# Patient Record
Sex: Female | Born: 1980 | Race: White | Hispanic: No | Marital: Married | Smoking: Former smoker
Health system: Southern US, Community
[De-identification: ages and names within clinical notes are randomized; demographics above are authoritative.]

## PROBLEM LIST (undated history)

## (undated) DIAGNOSIS — F419 Anxiety disorder, unspecified: Secondary | ICD-10-CM

## (undated) DIAGNOSIS — T7840XA Allergy, unspecified, initial encounter: Secondary | ICD-10-CM

## (undated) DIAGNOSIS — K8020 Calculus of gallbladder without cholecystitis without obstruction: Secondary | ICD-10-CM

## (undated) DIAGNOSIS — N200 Calculus of kidney: Secondary | ICD-10-CM

## (undated) HISTORY — PX: WISDOM TOOTH EXTRACTION: SHX21

## (undated) HISTORY — DX: Calculus of gallbladder without cholecystitis without obstruction: K80.20

## (undated) HISTORY — DX: Calculus of kidney: N20.0

## (undated) HISTORY — DX: Anxiety disorder, unspecified: F41.9

## (undated) HISTORY — DX: Allergy, unspecified, initial encounter: T78.40XA

---

## 2003-10-29 ENCOUNTER — Emergency Department (HOSPITAL_COMMUNITY): Admission: EM | Admit: 2003-10-29 | Discharge: 2003-10-29 | Payer: Self-pay | Admitting: Emergency Medicine

## 2005-01-14 ENCOUNTER — Encounter: Admission: RE | Admit: 2005-01-14 | Discharge: 2005-01-14 | Payer: Self-pay | Admitting: Family Medicine

## 2005-06-01 ENCOUNTER — Emergency Department (HOSPITAL_COMMUNITY): Admission: EM | Admit: 2005-06-01 | Discharge: 2005-06-01 | Payer: Self-pay | Admitting: Emergency Medicine

## 2005-07-09 ENCOUNTER — Ambulatory Visit (HOSPITAL_COMMUNITY): Admission: RE | Admit: 2005-07-09 | Discharge: 2005-07-09 | Payer: Self-pay | Admitting: Internal Medicine

## 2005-07-09 ENCOUNTER — Encounter: Payer: Self-pay | Admitting: Cardiology

## 2007-03-18 ENCOUNTER — Ambulatory Visit: Payer: Self-pay | Admitting: Cardiovascular Disease

## 2007-03-30 ENCOUNTER — Encounter: Payer: Self-pay | Admitting: Cardiovascular Disease

## 2007-03-30 ENCOUNTER — Ambulatory Visit: Payer: Self-pay

## 2007-04-27 ENCOUNTER — Ambulatory Visit: Payer: Self-pay | Admitting: Cardiovascular Disease

## 2007-04-27 ENCOUNTER — Ambulatory Visit: Payer: Self-pay | Admitting: Cardiology

## 2007-05-09 ENCOUNTER — Ambulatory Visit: Payer: Self-pay | Admitting: Cardiovascular Disease

## 2009-10-09 ENCOUNTER — Encounter
Admission: RE | Admit: 2009-10-09 | Discharge: 2010-01-07 | Payer: Self-pay | Source: Home / Self Care | Attending: Physical Medicine & Rehabilitation | Admitting: Physical Medicine & Rehabilitation

## 2009-10-15 ENCOUNTER — Ambulatory Visit: Payer: Self-pay | Admitting: Physical Medicine & Rehabilitation

## 2009-11-11 ENCOUNTER — Ambulatory Visit: Payer: Self-pay | Admitting: Physical Medicine & Rehabilitation

## 2010-05-27 NOTE — Assessment & Plan Note (Signed)
[facility]                            CARDIOLOGY OFFICE NOTE   NAME:Danielle [name], Danielle [name]                       MRN:          [id number]  DATE:03/18/2007                            DOB:          02-27-1980    Ms. Danielle [name] a 30-year patient referred by Dr. Kendell Bane for chest pain,  palpitations.  The patient has been having a problem since last summer.  Apparently she had a first anxiety attack then.  She has been having  palpitations.  They can occur at any time and not related to exertion.  I do have lab work from 2007 that showed normal thyroid test.  Her  father is a physician and apparently she has had an echo in his office  in August, which was normal without any significant MVP or structural  abnormalities.  We have an echo from 2007 which was also normal.   The palpitations did not cause any significant syncope.  They can be  associated with her chest pain.  The chest pain is atypical and can wake  her up in the middle the night.  It is sharp.  It is left-sided.  It has  been ongoing since August and has been a bit progressive over the last  month or two.   There has been no associated syncope.  She has not had a recent stress  test.   REVIEW OF SYSTEMS:  Otherwise negative.   PAST MEDICAL HISTORY:  Remarkable for history of adult attention deficit  disorder, previously on Adderall.  Did not tolerate this.  History of  back problems with disks inflamed, the patient is a previous smoker,  quit 3 years ago.  Question history of a reflux with trial of Nexium.   Patient is allergic to PENICILLIN and AMOXICILLIN.  She is currently  still taking Nexium 40 a day and doxycycline.   FAMILY HISTORY:  Noncontributory.  Mother and father both still alive at  age 35.   The patient is single.  She has a roommate.  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  She does not do any  illicit drugs.  She is fairly active at work but  sedentary outside.  She  has many animals at home that she likes to care for as a hobby.   EXAM:  Remarkable for healthy-appearing young white female in no  distress.  Her weight is 120, blood pressure is 120/70, pulse is 75 with  significant sinus arrhythmia, respiratory rate 14, afebrile.  HEENT:  Unremarkable.  Carotids are without bruit, no lymphadenopathy, thyromegaly JVP  elevation.  LUNGS:  Clear good diaphragmatic motion.  No wheezing.  S1-S2 with no murmur, rub, or gallop, click.  ABDOMEN:  Benign.  Bowel sounds positive.  No AAA, no tenderness, no  hepatosplenomegaly, no hepatojugular reflux.  Distal pulses intact.  No edema.  NEURO:  Nonfocal.  SKIN:  Warm and dry.  No muscular weakness.   EKG is normal.  She does have significant sinus arrhythmia with her  heart rate varying between 75 and 100  just with inspiration.   IMPRESSION:  1. Palpitations.  Sound benign.  No evidence structural heart disease.      She appears to have significant high autonomic tone with sinus      arrhythmia.  Will try the level this out with low-dose Viskin 5 mg      and see her back in 8 weeks.  I do not think there is an indication      for monitoring at this point.  2. Chest pain, atypical.  Nonexertional.  Followup stress echo, I      prefer to do this in a young female to avoid any radiation dose.      Previously structurally normal heart.  3. Acne.  Continue doxycycline p.r.n.  4. Adult onset attention deficit disorder.  Avoid stimulants such as      Adderall.  There are new non stimulant drugs for this but I suspect      that the patient is going to try to do with out any drugs for this.      She seems to have had an easier time concentrating lately.  5. Anxiety attacks.  This may be the root of her problems.  She is not      on any anxiolytics or SSRIs.  I will leave this up to her primary      care doctor.   I will see her back in 8 weeks so long as her stress echo is normal and   see how she feels on the Viskin.     Noralyn Pick. Eden Emms, MD, [facility]  Electronically Signed    PCN/MedQ  DD: 03/18/2007  DT: 03/18/2007  Job #: [id number]

## 2010-05-27 NOTE — Assessment & Plan Note (Signed)
[facility]                            CARDIOLOGY OFFICE NOTE   NAME:[name], Danielle CREDIT                       MRN:          [id number]  DATE:05/09/2007                            DOB:          1980/02/01    Danielle [name] was seen today in followup.  She has had atypical chest  pain.   She had a CT scan which showed no evidence of PE and no other thoracic  abnormalities.  She had a 2-D echocardiogram which showed normal LV  function.  She subsequently had stress echo performed which was normal.   She had no chest pain during the study.   In talking to Danielle [name], she continues to have atypical chest pain that  sounds musculoskeletal or something like Tietze syndrome.  She has not  had any syncope, palpitations, PND, or orthopnea.  There has been no  shortness of breath and no syncope.   REVIEW OF SYSTEMS:  Remarkable for recent developments in regards to  having cysts on her ovary and multiple kidney stones.  She was in the ER  recently and has a followup with a urologist in [facility] named Dr.  Lindley Magnus.   Since I last saw her she has been on the Visken 5 mg b.i.d. and feels  that she is doing better with less chest pain.   I told her we would try to keep her on the Visken probably for a year  until some of her other medical issues resolve.   CURRENT MEDICATIONS:  1. Nexium 40 a day.  2. Doxycycline.  3. Pindolol 5 b.i.d.  4. She has also gotten some Percocet for her kidney stones.   PHYSICAL EXAMINATION:  VITAL SIGNS:  Blood pressure 120/70, pulse is 70  with less of a sinus arrhythmia, respiratory rate 16, afebrile.  HEENT:  Unremarkable.  NECK:  Carotids are without bruit, no lymphadenopathy, thyromegaly, JVP  elevation.  LUNGS:  Clear with diaphragmatic motion.  No wheezing.  S1-S2 with  normal heart sounds.  PMI normal.  No pain to palpation on the chest.  ABDOMEN:  Benign.  Bowel sounds positive.  No hepatosplenomegaly or  hepatojugular  reflexes.  No AAA, no bruit.  EXTREMITIES:  Distal pulses intact, no edema.  NEUROLOGIC:  Nonfocal.  SKIN:  Warm and dry.  No muscular weakness.   IMPRESSION:  1. Chest pain, atypical.  Continue analgesics.  The patient has      Percocet for kidney stones which should help.  2. Sinus arrhythmia with variable heart rates, smoothed out by beta      blocker therapy which seems to be helping her atypical chest pain.      Continue followup in 6 months.  3. Kidney stones.  Follow up with Dr. Lindley Magnus.  She is currently not      having her urine alkalinized.  She has not passed any stones and      feels that the hematuria is improved.  Pain control per emergency      room prescription.  4. History of reflux.  Continue Nexium.  Avoid spicy foods  and late-      night meals.  5. Ovarian cysts.  Follow up with gynecologist.   Overall, I think the patient is doing well.  I will see her back in 6  months.  The only other test that may be in order if she continues to  have chest pain would be a cardiac CT to rule out anomalous coronary  arteries.     Noralyn Pick. Eden Emms, MD, [facility]  Electronically Signed    PCN/MedQ  DD: 05/09/2007  DT: 05/09/2007  Job #: [id number]

## 2012-03-17 ENCOUNTER — Ambulatory Visit (INDEPENDENT_AMBULATORY_CARE_PROVIDER_SITE_OTHER): Payer: BC Managed Care – PPO | Admitting: Family Medicine

## 2012-03-17 VITALS — BP 128/96 | HR 98 | Temp 97.5°F | Resp 16 | Ht 64.0 in | Wt 103.0 lb

## 2012-03-17 DIAGNOSIS — R634 Abnormal weight loss: Secondary | ICD-10-CM

## 2012-03-17 DIAGNOSIS — R109 Unspecified abdominal pain: Secondary | ICD-10-CM

## 2012-03-17 LAB — POCT CBC
Granulocyte percent: 52.8 %G (ref 37–80)
Hemoglobin: 14.3 g/dL (ref 12.2–16.2)
Lymph, poc: 3.6 — AB (ref 0.6–3.4)
MCH, POC: 30.9 pg (ref 27–31.2)
MCHC: 31.9 g/dL (ref 31.8–35.4)
POC Granulocyte: 4.6 (ref 2–6.9)
POC LYMPH PERCENT: 41.3 %L (ref 10–50)
POC MID %: 5.9 %M (ref 0–12)
RBC: 4.63 M/uL (ref 4.04–5.48)
RDW, POC: 12.9 %
WBC: 8.8 10*3/uL (ref 4.6–10.2)

## 2012-03-17 LAB — POCT URINALYSIS DIPSTICK
Bilirubin, UA: NEGATIVE
Glucose, UA: NEGATIVE
Ketones, UA: NEGATIVE
Protein, UA: NEGATIVE

## 2012-03-17 LAB — POCT UA - MICROSCOPIC ONLY
Casts, Ur, LPF, POC: NEGATIVE
Crystals, Ur, HPF, POC: NEGATIVE
Yeast, UA: NEGATIVE

## 2012-03-17 NOTE — Patient Instructions (Addendum)
Abdominal  pain, other specified site - Plan: POCT SEDIMENTATION RATE, POCT CBC, TSH, POCT UA - Microscopic Only, POCT urinalysis dipstick, Comprehensive metabolic panel  Weight loss, unintentional

## 2012-03-17 NOTE — Progress Notes (Signed)
[address]   [place], [place]  [address]   [id number]  Subjective:    Patient ID: Danielle Danielle [name], female    DOB: 1980/10/27, 32 y.o.   MRN: [id number]  HPI This 32 y.o. female presents for evaluation of the following:  1.  LLQ abdominal: onset one week ago.  No fever in past week. Sick one week ago with fever, coughing, nasal congestion.  No n/v; +diarrhea x 2-3 episodes a few days ago; no bloody stools; no melena.  No mucous in stool.  No constipation.  History of kidney stones; +urgency chronically; no dysuria, no hematuria.  No flank pain.  No vaginal discharge; no vaginal irritation.  Sexually active with females only; no recent female encounters.  History of ovarian cyst rupture five years ago; this pain not as severe.  No ulcerative colitis or Crohn's disease in family.  Worsening pain some in past few days.  Intermittent change in position makes worse.  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  Getting out of car mildly worsens pain.  No associated lower back pain or hip pain.  No radiation into legs; no n/t/w in legs.  No saddle paresthesias; no b/b dysfunction.  2. Weight loss:  Weighed 110 two months ago ; has lost seven pounds in two months.  No weight gain during the holidays.  + Increase stress lately; working and in school.  Eat less while stressed.  Eats more at night.  No change in exercise in past three months.  No new jobs in past few months.  No thyroid issues in family.   Review of Systems  Constitutional: Positive for appetite change and unexpected weight change. Negative for fever, chills, diaphoresis and fatigue.  HENT: Negative for congestion, rhinorrhea and postnasal drip.   Respiratory: Negative for shortness of breath.   Gastrointestinal: Positive for abdominal pain and diarrhea. Negative for nausea, vomiting, constipation, blood in stool, abdominal distention, anal bleeding and rectal pain.  Genitourinary:  Positive for pelvic pain. Negative for dysuria, urgency, frequency, hematuria, flank pain, decreased urine volume, vaginal bleeding, vaginal discharge, difficulty urinating, genital sores, vaginal pain and menstrual problem.  Musculoskeletal: Negative for myalgias, back pain, joint swelling, arthralgias and gait problem.  Psychiatric/Behavioral: The patient is nervous/anxious.         Past Medical History  Diagnosis Date  . Allergy   . Anxiety     History reviewed. No pertinent past surgical history.  Prior to Admission medications   Medication Sig Start Date End Date Taking? Authorizing Provider  ALPRAZolam Prudy Feeler) 0.25 MG tablet Take 0.25 mg by mouth at bedtime as needed for sleep.   Yes Historical Provider, MD  amphetamine-dextroamphetamine (ADDERALL) 20 MG tablet Take 20 mg by mouth daily.   Yes Historical Provider, MD  doxycycline (MONODOX) 75 MG capsule Take 100 mg by mouth daily.   Yes Historical Provider, MD  metoprolol (LOPRESSOR) 50 MG tablet Take 50 mg by mouth 2 (two) times daily.   Yes Historical Provider, MD    Allergies  Allergen Reactions  . Amoxicillin Rash  . Penicillins Rash    History   Social History  . Marital Status: Single    Spouse Name: N/A    Number of Children: N/A  . Years of Education: N/A   Occupational History  . Not on file.   Social History Main Topics  . Smoking status: Current Every Day Smoker -- 0.50 packs/day    Types: Cigarettes  .  Smokeless tobacco: Never Used  . Alcohol Use: Yes  . Drug Use: No  . Sexually Active: No   Other Topics Concern  . Not on file   Social History Narrative   Marital: single; dating seriously; same sex partner.      Children: none      Lives with: with girlfriend.      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      Exercise: no formal exercise.    Family History  Problem Relation Age of Onset  . Hypertension Mother     Objective:   Physical Exam  Nursing note and vitals  reviewed. Constitutional: She is oriented to Danielle [name], place, and time. She appears well-developed and well-nourished. No distress.  HENT:  Mouth/Throat: Oropharynx is clear and moist.  Eyes: Conjunctivae and EOM are normal. Pupils are equal, round, and reactive to light.  Neck: Normal range of motion. Neck supple. No thyromegaly present.  Cardiovascular: Normal rate, regular rhythm and normal heart sounds.   Pulmonary/Chest: Effort normal and breath sounds normal. She has no wheezes. She has no rales.  Abdominal: Soft. Bowel sounds are normal. She exhibits no distension and no mass. There is no hepatosplenomegaly. There is tenderness in the left lower quadrant. There is no rebound, no guarding and no CVA tenderness. No hernia. Hernia confirmed negative in the ventral area.  Genitourinary:  Refused pelvic.  Musculoskeletal:       Left hip: Normal.       Lumbar back: Normal.  Neurological: She is alert and oriented to Danielle [name], place, and time.  Skin: Skin is warm and dry. No rash noted. She is not diaphoretic.  Psychiatric: She has a normal mood and affect. Her behavior is normal. Judgment and thought content normal.  Short brief answers.   Results for orders placed in visit on 03/17/12  POCT CBC      Result Value Range   WBC 8.8  4.6 - 10.2 K/uL   Lymph, poc 3.6 (*) 0.6 - 3.4   POC LYMPH PERCENT 41.3  10 - 50 %L   MID (cbc) 0.5  0 - 0.9   POC MID % 5.9  0 - 12 %M   POC Granulocyte 4.6  2 - 6.9   Granulocyte percent 52.8  37 - 80 %G   RBC 4.63  4.04 - 5.48 M/uL   Hemoglobin 14.3  12.2 - 16.2 g/dL   HCT, POC 16.1  09.6 - 47.9 %   MCV 96.8  80 - 97 fL   MCH, POC 30.9  27 - 31.2 pg   MCHC 31.9  31.8 - 35.4 g/dL   RDW, POC 04.5     Platelet Count, POC 487 (*) 142 - 424 K/uL   MPV 7.4  0 - 99.8 fL  POCT UA - MICROSCOPIC ONLY      Result Value Range   WBC, Ur, HPF, POC 0-2     RBC, urine, microscopic 0-1     Bacteria, U Microscopic trace     Mucus, UA neg     Epithelial cells, urine  per micros 0-2     Crystals, Ur, HPF, POC neg     Casts, Ur, LPF, POC neg     Yeast, UA neg    POCT URINALYSIS DIPSTICK      Result Value Range   Color, UA yellow     Clarity, UA clear     Glucose, UA neg     Bilirubin, UA neg  Ketones, UA neg     Spec Grav, UA 1.015     Blood, UA neg     pH, UA 7.0     Protein, UA neg     Urobilinogen, UA 0.2     Nitrite, UA neg     Leukocytes, UA Negative          Assessment & Plan:  Abdominal  pain, other specified site - Plan: POCT SEDIMENTATION RATE, POCT CBC, TSH, POCT UA - Microscopic Only, POCT urinalysis dipstick, Comprehensive metabolic panel  Weight loss, unintentional  1.  Abdominal pain LLQ:  New.  Onset in past week..  Associated with mild diarrhea.  Urine negative.  Pt refused pelvic exam.  If pain persists, recommend pelvic exam and pelvic ultrasound.  Recommend observation for next several days. RTC for persistent or worsening pain. 2.  Weight Loss Unintentional:  New.  Seven pound weight loss in two months; multiple stressors may be etiology; obtain labs including ESR and TSH.

## 2012-03-20 LAB — COMPREHENSIVE METABOLIC PANEL
AST: 15 U/L (ref 0–37)
Albumin: 5.3 g/dL — ABNORMAL HIGH (ref 3.5–5.2)
Alkaline Phosphatase: 58 U/L (ref 39–117)
CO2: 20 mEq/L (ref 19–32)
Creat: 0.83 mg/dL (ref 0.50–1.10)
Glucose, Bld: 102 mg/dL — ABNORMAL HIGH (ref 70–99)
Sodium: 139 mEq/L (ref 135–145)
Total Protein: 8 g/dL (ref 6.0–8.3)

## 2012-04-04 NOTE — Progress Notes (Signed)
Left msg for pt to call to schedule 4-6 week f-up with Dr. Katrinka Blazing.

## 2012-04-07 NOTE — Progress Notes (Signed)
Sent reminder letter to pt to schedule f-up appt.

## 2012-07-09 ENCOUNTER — Ambulatory Visit (INDEPENDENT_AMBULATORY_CARE_PROVIDER_SITE_OTHER): Payer: BC Managed Care – PPO | Admitting: Family Medicine

## 2012-07-09 ENCOUNTER — Ambulatory Visit: Payer: BC Managed Care – PPO

## 2012-07-09 VITALS — BP 114/79 | HR 75 | Temp 98.4°F | Resp 16 | Ht 64.0 in | Wt 106.6 lb

## 2012-07-09 DIAGNOSIS — M25522 Pain in left elbow: Secondary | ICD-10-CM

## 2012-07-09 DIAGNOSIS — M6740 Ganglion, unspecified site: Secondary | ICD-10-CM

## 2012-07-09 DIAGNOSIS — M25529 Pain in unspecified elbow: Secondary | ICD-10-CM

## 2012-07-09 MED ORDER — DICLOFENAC SODIUM 75 MG PO TBEC: 75.0000 mg | DELAYED_RELEASE_TABLET | Freq: Two times a day (BID) | ORAL | Status: DC

## 2012-07-09 NOTE — Progress Notes (Signed)
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 She comes in today with left forearm and hand paresthesias and pain radiating to her shoulder. This is associated with a nodule which is relatively new which is felt in the mid radial volar forearm. He's had no problems with trauma, and has complete range of motion without pain of the left arm.  Patient is left-handed  Patient has no new medications, no fever, and as mentioned above no trauma.  Objective: No acute distress, thin woman. She seen with both parents in the room.  Patient has normal neck range of motion and is nontender in the neck or upper shoulder. Shoulder range of motion left is normal. Left elbow range of motion is normal. Left wrist and fingers have normal range of motion and normal inspection.  The left forearm does show a half centimeter firm nodule midway down the radius. This appears to be mobile.  Review of past labs reveals an elevated calcium done back in March. [facility] reading (PRIMARY) by  Dr. Milus Glazier: no bony abnormality  Assessment:  I believe the nodule in the left forearm is a ganglion cyst that it's causing paresthesias pain.  Plan: Refer to orthopedics, the Voltaren 75 twice a day in the meantime. Marland Kitchen

## 2012-07-12 ENCOUNTER — Encounter: Payer: Self-pay | Admitting: Family Medicine

## 2012-07-12 ENCOUNTER — Ambulatory Visit (INDEPENDENT_AMBULATORY_CARE_PROVIDER_SITE_OTHER): Payer: BC Managed Care – PPO | Admitting: Family Medicine

## 2012-07-12 VITALS — BP 106/68 | HR 139 | Temp 99.1°F | Resp 16 | Ht 63.25 in | Wt 104.6 lb

## 2012-07-12 DIAGNOSIS — R229 Localized swelling, mass and lump, unspecified: Secondary | ICD-10-CM

## 2012-07-12 DIAGNOSIS — Z79899 Other long term (current) drug therapy: Secondary | ICD-10-CM

## 2012-07-12 DIAGNOSIS — I498 Other specified cardiac arrhythmias: Secondary | ICD-10-CM

## 2012-07-12 DIAGNOSIS — R000 Tachycardia, unspecified: Secondary | ICD-10-CM

## 2012-07-12 DIAGNOSIS — F909 Attention-deficit hyperactivity disorder, unspecified type: Secondary | ICD-10-CM

## 2012-07-12 DIAGNOSIS — Z5189 Encounter for other specified aftercare: Secondary | ICD-10-CM

## 2012-07-12 MED ORDER — TRAMADOL HCL 50 MG PO TABS: 50.0000 mg | ORAL_TABLET | Freq: Three times a day (TID) | ORAL | Status: DC | PRN

## 2012-07-12 MED ORDER — CYCLOBENZAPRINE HCL 10 MG PO TABS: 10.0000 mg | ORAL_TABLET | Freq: Three times a day (TID) | ORAL | Status: DC | PRN

## 2012-07-12 MED ORDER — ALPRAZOLAM 0.25 MG PO TABS: 0.2500 mg | ORAL_TABLET | Freq: Every evening | ORAL | Status: DC | PRN

## 2012-07-12 MED ORDER — AMPHETAMINE-DEXTROAMPHETAMINE 30 MG PO TABS: ORAL_TABLET | ORAL | Status: DC

## 2012-07-12 MED ORDER — METOPROLOL SUCCINATE ER 100 MG PO TB24: 100.0000 mg | ORAL_TABLET | Freq: Every day | ORAL | Status: DC

## 2012-07-12 MED ORDER — DOXYCYCLINE MONOHYDRATE 100 MG PO CAPS: 100.0000 mg | ORAL_CAPSULE | Freq: Every day | ORAL | Status: DC

## 2012-07-12 NOTE — Patient Instructions (Addendum)
I have prescribed a medication called Tramadol for pain. Take 1 tablet every 8 hours with a snack; taken on an empty stomach, it can cause some nausea and dizziness. Try warm compresses on that area and a topical analgesic (i.e. Arnica Gel) could be helpful.

## 2012-07-14 ENCOUNTER — Encounter: Payer: Self-pay | Admitting: Family Medicine

## 2012-07-14 DIAGNOSIS — Z8659 Personal history of other mental and behavioral disorders: Secondary | ICD-10-CM | POA: Insufficient documentation

## 2012-07-14 DIAGNOSIS — Z658 Other specified problems related to psychosocial circumstances: Secondary | ICD-10-CM | POA: Insufficient documentation

## 2012-07-14 DIAGNOSIS — M5136 Other intervertebral disc degeneration, lumbar region: Secondary | ICD-10-CM | POA: Insufficient documentation

## 2012-07-14 DIAGNOSIS — Z8742 Personal history of other diseases of the female genital tract: Secondary | ICD-10-CM | POA: Insufficient documentation

## 2012-07-14 DIAGNOSIS — R000 Tachycardia, unspecified: Secondary | ICD-10-CM | POA: Insufficient documentation

## 2012-07-14 DIAGNOSIS — Z87442 Personal history of urinary calculi: Secondary | ICD-10-CM | POA: Insufficient documentation

## 2012-07-14 NOTE — Progress Notes (Signed)
S:  This 32 y.o. Cauc female has ADHD, treated for at least 10 years; current medication is effective and helps her focus and complete tasks. 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 Pt takes 1/2 tablet twice a day but does complain that medication wears off earlier than she would like. She denies side effects such as anorexia, diaphoresis, CP or tightness, GI problems, HA, dizziness, tremor or behavior changes.   Pt has "high metabolism" and chronic sinus tachycardia treated w/ beta-blocker. She denies abnormal weight change, fatigue, SOB or DOE, cough or insomnia.  Pt has a cyst on L forearm, eval at [facility] on 07/09/12; she has ORTHO appt w/in next week or so but c/o pain. She requests something for pain pending further assessment. She has no loss of use or weakness in L arm or hand.  Patient Active Problem List   Diagnosis Date Noted  . ADHD (attention deficit hyperactivity disorder) 07/12/2012    PMHx, Soc Hx and  Fam Hx reviewed.  ROS: As per HPI.  O: Filed Vitals:   07/12/12 1554  BP: 106/68  Pulse: 139  Temp: 99.1 F (37.3 C)  Resp: 16    GEN: In NAD; WN,WD. Slender body habitus. HENT: [place]/AT; EOMI w/ clear conj/sclerae. EACs/TMs normal. Nasal mucosa and oroph clear. NECK: Supple w/o LAN or TMG. COR: RRR. PMI nondisplaced. No m/g/r. LUNGS: CTA; normal resp rate and effort. ABD: Flat and soft. NT w/o guarding. No masses or HSM. MS: MAEs; no c/c/e. NEURO: A&O x 3; CNs intact. DTRs 2+/=. Nonfocal.  A/P: ADHD (attention deficit hyperactivity disorder)- Increase Adderall to 30 mg  Take 1/2 tablet twice a day.  Sinus tachycardia- Continue Beta- blocker. Reduce caffeine intake.  Medication management  Single skin nodule- ORTHO eval pending. RX: Tramadol; warm compresses and topical analgesic.  Meds ordered this encounter  Medications  . ALPRAZolam (XANAX) 0.25 MG tablet    Sig: Take 1 tablet (0.25 mg total) by mouth at bedtime as needed for  sleep.    Dispense:  30 tablet    Refill:  0  . cyclobenzaprine (FLEXERIL) 10 MG tablet    Sig: Take 1 tablet (10 mg total) by mouth 3 (three) times daily as needed for muscle spasms.    Dispense:  30 tablet    Refill:  0  . doxycycline (MONODOX) 100 MG capsule    Sig: Take 1 capsule (100 mg total) by mouth daily.    Dispense:  30 capsule    Refill:  5  . metoprolol succinate (TOPROL-XL) 100 MG 24 hr tablet    Sig: Take 1 tablet (100 mg total) by mouth daily. Take with or immediately following a meal.    Dispense:  30 tablet    Refill:  5  . DISCONTD: amphetamine-dextroamphetamine (ADDERALL) 30 MG tablet    Sig: Take 1/2 tablet twice a day or as directed.    Dispense:  30 tablet    Refill:  0  . DISCONTD: amphetamine-dextroamphetamine (ADDERALL) 30 MG tablet    Sig: Take 1/2 tablet twice a day or as directed.    Dispense:  30 tablet    Refill:  0    Do not fill before August 11, 2012.  Marland Kitchen amphetamine-dextroamphetamine (ADDERALL) 30 MG tablet    Sig: Take 1/2 tablet twice a day or as directed.    Dispense:  30 tablet    Refill:  0    Do not fill before September 10, 2012.  . traMADol (ULTRAM) 50 MG tablet    Sig: Take 1 tablet (50 mg total) by mouth every 8 (eight) hours as needed for pain.    Dispense:  40 tablet    Refill:  0

## 2012-08-26 ENCOUNTER — Telehealth: Payer: Self-pay

## 2012-08-26 MED ORDER — ALPRAZOLAM 0.25 MG PO TABS: 0.2500 mg | ORAL_TABLET | Freq: Every evening | ORAL | Status: DC | PRN

## 2012-08-26 NOTE — Telephone Encounter (Signed)
Pharm requests Rf of alprazolam 0.25 mg tabs

## 2012-08-26 NOTE — Telephone Encounter (Signed)
Alprazolam refill phoned to pt's pharmacy. 

## 2012-09-19 ENCOUNTER — Telehealth: Payer: Self-pay

## 2012-09-19 NOTE — Telephone Encounter (Signed)
Pt need refills on alprazolam and adderall. Please call when ready [phone]

## 2012-09-20 MED ORDER — AMPHETAMINE-DEXTROAMPHETAMINE 30 MG PO TABS: ORAL_TABLET | ORAL | Status: DC

## 2012-09-20 MED ORDER — ALPRAZOLAM 0.25 MG PO TABS: 0.2500 mg | ORAL_TABLET | Freq: Every evening | ORAL | Status: DC | PRN

## 2012-09-20 NOTE — Telephone Encounter (Signed)
lmom that rx was ready for pickup and that she needs to schedule ov

## 2012-09-20 NOTE — Telephone Encounter (Signed)
Medication refills printed out; available for pickup at [facility] on Wednesday, Sept 10, 2014. Pt will have to sch OV to get more refills.

## 2012-09-21 ENCOUNTER — Telehealth: Payer: Self-pay

## 2012-09-21 NOTE — Telephone Encounter (Signed)
[facility] - Pt is out of adderall.  She says the date on the bottle says Sept. 28 and that is the wrong date.  Says it needs to be refilled and that she has an appointment on Friday about this.  [phone]

## 2012-09-22 NOTE — Telephone Encounter (Signed)
I called pt's pharmacy and authorized early refill. Pt had not presented RX  At time of phone call. Pharmacy will fill Adderall RX when she presents RX and will fill next one in 30 days. Pt does not need to come in to office tomorrow if that is the issue she wishes to address. She does need to come in when next Rx due (~2 months).  I left her a message asking that she call and cancel appt if she does not need to come.

## 2012-09-23 ENCOUNTER — Ambulatory Visit: Payer: BC Managed Care – PPO | Admitting: Family Medicine

## 2012-09-24 ENCOUNTER — Telehealth: Payer: Self-pay

## 2012-09-24 DIAGNOSIS — F909 Attention-deficit hyperactivity disorder, unspecified type: Secondary | ICD-10-CM

## 2012-09-24 MED ORDER — AMPHETAMINE-DEXTROAMPHETAMINE 30 MG PO TABS: ORAL_TABLET | ORAL | Status: DC

## 2012-09-24 NOTE — Telephone Encounter (Signed)
See Dr. Audria Nine previous note 09/21/12

## 2012-09-24 NOTE — Telephone Encounter (Signed)
Left message to return call. Dr. Audria Nine will be back in office Tuesday afternoon.

## 2012-09-24 NOTE — Telephone Encounter (Signed)
Patient states her adderall was approved to be filled two days ago,  The pharmacy says they cannot legally fill the script over the phone.  She has to come in to the clinic and pick up a new script with todays date on it.  Stated she called earlier and was told  Dr. Audria Nine was working today and would take care of this for her.  She wants someone to write the script for her and call her today.   [phone]

## 2012-09-24 NOTE — Telephone Encounter (Signed)
Rx reprinted.  Must return original rx to pick up new rx.

## 2012-09-24 NOTE — Telephone Encounter (Signed)
Patient returned call and stated the pharmacy told her legaly they can not change date for this rx even with verbal from provider. They need new rx with correct date. Spoke with Dr. Katrinka Blazing and will write new rx per Dr. Angelyn Punt documentation but patient will need to bring in old rx. Patient notified and voiced understanding. She will bring in new rx this afternoon.

## 2012-11-10 ENCOUNTER — Ambulatory Visit (INDEPENDENT_AMBULATORY_CARE_PROVIDER_SITE_OTHER): Payer: BC Managed Care – PPO | Admitting: Family Medicine

## 2012-11-10 ENCOUNTER — Encounter: Payer: Self-pay | Admitting: Family Medicine

## 2012-11-10 VITALS — BP 122/76 | HR 112 | Temp 98.3°F | Resp 16 | Ht 63.0 in | Wt 105.0 lb

## 2012-11-10 DIAGNOSIS — R000 Tachycardia, unspecified: Secondary | ICD-10-CM

## 2012-11-10 DIAGNOSIS — F909 Attention-deficit hyperactivity disorder, unspecified type: Secondary | ICD-10-CM

## 2012-11-10 DIAGNOSIS — Z23 Encounter for immunization: Secondary | ICD-10-CM

## 2012-11-10 DIAGNOSIS — I498 Other specified cardiac arrhythmias: Secondary | ICD-10-CM

## 2012-11-10 DIAGNOSIS — F411 Generalized anxiety disorder: Secondary | ICD-10-CM

## 2012-11-10 MED ORDER — METOPROLOL SUCCINATE ER 100 MG PO TB24: 100.0000 mg | ORAL_TABLET | Freq: Every day | ORAL | Status: DC

## 2012-11-10 MED ORDER — DOXYCYCLINE MONOHYDRATE 100 MG PO CAPS: 100.0000 mg | ORAL_CAPSULE | Freq: Every day | ORAL | Status: DC

## 2012-11-10 MED ORDER — AMPHETAMINE-DEXTROAMPHET ER 30 MG PO CP24: 30.0000 mg | ORAL_CAPSULE | ORAL | Status: DC

## 2012-11-10 MED ORDER — TETANUS-DIPHTH-ACELL PERTUSSIS 5-2.5-18.5 LF-MCG/0.5 IM SUSP: 0.5000 mL | Freq: Once | INTRAMUSCULAR | Status: DC

## 2012-11-10 MED ORDER — ALPRAZOLAM 0.5 MG PO TABS: 0.5000 mg | ORAL_TABLET | Freq: Every evening | ORAL | Status: DC | PRN

## 2012-11-10 NOTE — Patient Instructions (Signed)
You received the Tdap vaccine today. Your next Tetanus is due in 2024.

## 2012-11-13 NOTE — Progress Notes (Signed)
Patient ID: Danielle [name] MRN: [id number], DOB: December 29, 1980, 32 y.o.  Primary Physician: Maurice March, M.D.  Chief Complaint: Follow up ADHD and Anxiety  HPI: 32 y.o. year old female with history below presents for follow up of ADHD. Doing well with Adderall (generic) but would like to change to Xr formulation so that she can take medication once a day. Takes medication daily. No decrease of appetite. No changes to sleeping habits. HAs persistent and unrelated tachycardia; metoprolol succinate helps control palpitations. 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 The job is, at times, physically demanding; she is of small stature and some of her clients are over 6 feet tal and weight more than 200 lbs. She has to use physical restraints rarely but has pulled a muscle in right ribcage/upper abdomen recently.   Past Medical History  Diagnosis Date  . Allergy   . Anxiety      Home Meds: Prior to Admission medications   Medication Sig Start Date End Date Taking? Authorizing Provider  doxycycline (MONODOX) 100 MG capsule Take 1 capsule (100 mg total) by mouth daily. 11/10/12  Yes Maurice March, MD  metoprolol succinate (TOPROL-XL) 100 MG 24 hr tablet Take 1 tablet (100 mg total) by mouth daily. Take with or immediately following a meal. 11/10/12  Yes Maurice March, MD  ALPRAZolam Prudy Feeler) 0.5 MG tablet Take 1 tablet (0.5 mg total) by mouth at bedtime as needed for sleep. 11/10/12   Maurice March, MD  amphetamine-dextroamphetamine (ADDERALL XR) 30 MG 24 hr capsule Take 1 capsule (30 mg total) by mouth every morning. 11/10/12   Maurice March, MD  traMADol (ULTRAM) 50 MG tablet Take 1 tablet (50 mg total) by mouth every 8 (eight) hours as needed for pain. 07/12/12   Maurice March, MD    Allergies:  Allergies  Allergen Reactions  . Amoxicillin Rash  . Penicillins Rash    Social History Main Topics  .  Smoking status: Former Smoker -- 0.50 packs/day    Types: Cigarettes  . Smokeless tobacco: Never Used  . Alcohol Use: No  . Drug Use: No  . Sexual Activity: No   Social History Narrative   Marital: single; dating seriously; same sex partner.      Children: none      Lives with:  girlfriend.      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      Exercise: no formal exercise.     Review of Systems: Constitutional: negative for chills, fever, night sweats, weight changes, or fatigue  HEENT: negative for vision changes or hearing loss Cardiovascular: negative for chest pain or palpitations Abdominal: negative for abdominal pain, nausea, vomiting, diarrhea, or constipation Dermatological: negative for rash Neurologic: negative for headache, dizziness, or syncope All other systems reviewed and are otherwise negative with the exception to those above and in the HPI.   Physical Exam: Blood pressure 122/76, pulse 112, temperature 98.3 F (36.8 C), temperature source Oral, resp. rate 16, height 5\' 3"  (1.6 m), weight 105 lb (47.628 kg), last menstrual period 10/31/2012, SpO2 100.00%., Body mass index is 18.6 kg/(m^2). General: Well developed, well nourished, in no acute distress. Head: Normocephalic, atraumatic, eyes without discharge, sclera non-icteric, nares are without discharge. Bilateral auditory canals clear, TM's are without perforation, pearly grey and translucent with reflective cone of light bilaterally. Oroph unremarkable.  Neck: Supple. No thyromegaly. Full ROM. No lymphadenopathy. Lungs: Clear bilaterally to auscultation  without wheezes, rales, or rhonchi. Breathing is unlabored. Heart: RRR with S1 S2. No murmurs, rubs, or gallops appreciated. Msk:  Strength and tone normal for age. Extremities/Skin: Warm and dry. No clubbing or cyanosis. No edema. No rashes, erythema or pallor. Neuro: Alert and oriented X 3. Moves all extremities spontaneously. Gait is normal. CNII-XII  grossly in tact. Psych:  Responds to questions appropriately with a normal affect.    ASSESSMENT AND PLAN:  ADHD (attention deficit hyperactivity disorder)- Change stimulant formulation from twice a day to generic Adderall XR 30 mg  1 capsule every morning.  Anxiety state, unspecified- Stable and controlled with alprazolam prn use.  Sinus tachycardia- Stable; continue B-Blocker at currne t dose.  Need for prophylactic vaccination with combined diphtheria-tetanus-pertussis (DTP) vaccine - Plan: TDaP (BOOSTRIX) injection 0.5 mL  Meds ordered this encounter  Medications  . ALPRAZolam (XANAX) 0.5 MG tablet    Sig: Take 1 tablet (0.5 mg total) by mouth at bedtime as needed for sleep.    Dispense:  30 tablet    Refill:  1    30 tabs must last 30 days.  . metoprolol succinate (TOPROL-XL) 100 MG 24 hr tablet    Sig: Take 1 tablet (100 mg total) by mouth daily. Take with or immediately following a meal.    Dispense:  30 tablet    Refill:  5  . doxycycline (MONODOX) 100 MG capsule    Sig: Take 1 capsule (100 mg total) by mouth daily.    Dispense:  30 capsule    Refill:  5  . DISCONTD: amphetamine-dextroamphetamine (ADDERALL XR) 30 MG 24 hr capsule    Sig: Take 1 capsule (30 mg total) by mouth every morning.    Dispense:  30 capsule    Refill:  0  . DISCONTD: amphetamine-dextroamphetamine (ADDERALL XR) 30 MG 24 hr capsule    Sig: Take 1 capsule (30 mg total) by mouth every morning.    Dispense:  30 capsule    Refill:  0    Do not fill before Dec 10, 2012.  Marland Kitchen DISCONTD: amphetamine-dextroamphetamine (ADDERALL XR) 30 MG 24 hr capsule    Sig: Take 1 capsule (30 mg total) by mouth every morning.    Dispense:  30 capsule    Refill:  0    Do not fill before January 09, 2013.  Marland Kitchen amphetamine-dextroamphetamine (ADDERALL XR) 30 MG 24 hr capsule    Sig: Take 1 capsule (30 mg total) by mouth every morning.    Dispense:  30 capsule    Refill:  0    Do not fill before February 09, 2012.  . TDaP  (BOOSTRIX) injection 0.5 mL    Sig:

## 2012-12-20 ENCOUNTER — Telehealth: Payer: Self-pay

## 2012-12-20 MED ORDER — ALPRAZOLAM 0.5 MG PO TABS: 0.5000 mg | ORAL_TABLET | Freq: Every evening | ORAL | Status: DC | PRN

## 2012-12-20 NOTE — Telephone Encounter (Signed)
I prescribed Alprazolam w/ note to pharmacy reading that "30 tablets must last 30 days". Last refill authorized on 11/10/12 with 1 additional refill. I will refill medication early this time only. Please advise pt that medication should be taken as prescribed. Prescription can be picked up at [facility].

## 2012-12-20 NOTE — Telephone Encounter (Signed)
PT STATES SHE NEED A REFILL ON HER XANAX BECAUSE SHE HAD TO INCREASE THE DOSAGE. PLEASE CALL [phone] WHEN READY FOR PICK UP

## 2012-12-21 NOTE — Telephone Encounter (Signed)
Called, no answer.

## 2012-12-21 NOTE — Telephone Encounter (Signed)
Called again to advise. Left message for her to call me back.

## 2012-12-22 ENCOUNTER — Telehealth: Payer: Self-pay | Admitting: Radiology

## 2012-12-22 NOTE — Telephone Encounter (Signed)
Patient called back, and states she will take as directed. Have asked Aram Beecham to make sure it gets delivered to [address] so she can pick up on Sat.

## 2012-12-22 NOTE — Telephone Encounter (Signed)
Patient has not returned my calls

## 2013-01-06 ENCOUNTER — Telehealth: Payer: Self-pay

## 2013-01-06 NOTE — Telephone Encounter (Signed)
LMOM for patient to CB. She will nedd to come for OV. She had her Xanax filled on 12/24/12 for a 30 day supply. It has only been 13 days.

## 2013-01-06 NOTE — Telephone Encounter (Signed)
Pt would like a refill on her xanax, pt states that she recently moved and does not have a lot of time to come in due to her work schedule. Best# [phone]

## 2013-03-09 ENCOUNTER — Encounter: Payer: BC Managed Care – PPO | Admitting: Family Medicine

## 2013-03-28 ENCOUNTER — Encounter: Payer: Self-pay | Admitting: Family Medicine

## 2013-03-28 ENCOUNTER — Ambulatory Visit (INDEPENDENT_AMBULATORY_CARE_PROVIDER_SITE_OTHER): Payer: No Typology Code available for payment source | Admitting: Family Medicine

## 2013-03-28 VITALS — BP 94/68 | HR 93 | Temp 98.7°F | Resp 16 | Ht 63.0 in | Wt 113.8 lb

## 2013-03-28 DIAGNOSIS — F909 Attention-deficit hyperactivity disorder, unspecified type: Secondary | ICD-10-CM

## 2013-03-28 MED ORDER — AMPHETAMINE-DEXTROAMPHET ER 30 MG PO CP24: 30.0000 mg | ORAL_CAPSULE | ORAL | Status: DC

## 2013-03-28 MED ORDER — AMPHETAMINE-DEXTROAMPHETAMINE 10 MG PO TABS: ORAL_TABLET | ORAL | Status: DC

## 2013-03-28 MED ORDER — ALPRAZOLAM 0.5 MG PO TABS: 0.5000 mg | ORAL_TABLET | Freq: Every evening | ORAL | Status: DC | PRN

## 2013-03-28 NOTE — Progress Notes (Signed)
S:  This 33 y.o. Cauc female has ADHD, treated w/ Adderall XR 30 mg 1 capsule daily. Pt reports that medication seems to wear off in afternoon. 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 She sleeps about 6 hours/night. Appetite is good; she has gained 9 lbs since July 2014. She denies diaphoresis, CP or tightness, palpitations, SOB, nausea, HA, dizziness, weakness, tremor or syncope. She reports no agitation or behavior problems, confusion or dysmorphic mood.  Patient Active Problem List   Diagnosis Date Noted  . Sinus tachycardia 07/14/2012  . History of panic disorder 07/14/2012  . History of kidney stones 07/14/2012  . DDD (degenerative disc disease), lumbar 07/14/2012  . History of ovarian cyst 07/14/2012  . ADHD (attention deficit hyperactivity disorder) 07/12/2012   PMHx, Surg Hx, Soc and Fam Hx reviewed.  MEDICATIONS reconciled.  ROS: As per HPI.  O: Filed Vitals:   03/28/13 1502  BP: 94/68  Pulse: 93  Temp: 98.7 F (37.1 C)  Resp: 16   GEN: In NAD: WN,WD. HENT: [place]/AT; EOMI w/ clear conj/sclerae. Otherwise unremarkable, NECK: Supple w/o LAN or TMG. COR: RRR w/ normal S1 and S2; no m/g/r. LUNGS: CTA; normal resp rate and effort. BACK: No CVAT. ABD: Normal appearance; soft, NT and flat w/o guarding, masses or HSM. SKIN: W&D; intact w/o erythema, diaphoresis, jaundice or pallor. MS: MAEs; no deformities or muscle atrophy. No c/c/e. NEURO: A&O x 3; CNs intact. Nonfocal. PSYCH: Pleasant and reserved. Calm and attentive demeanor w/ good eye contact. Speech pattern and thought content normal. Judgement sound.  A/P: ADHD (attention deficit hyperactivity disorder)- Pt's current work and study schedule demands extra attention and concentration efforts. Will continue Adderall XR 30 mg every morning and add a low dose of short-acting Adderall 10 mg in the mid-afternoon. Pt to contact me if she has any problems with this regimen or finds  that it is not effective.   Meds ordered this encounter  Medications  . ALPRAZolam (XANAX) 0.5 MG tablet    Sig: Take 1 tablet (0.5 mg total) by mouth at bedtime as needed for sleep.    Dispense:  30 tablet    Refill:  0    30 tabs must last at least 30 days.  Marland Kitchen. DISCONTD: amphetamine-dextroamphetamine (ADDERALL XR) 30 MG 24 hr capsule    Sig: Take 1 capsule (30 mg total) by mouth every morning.    Dispense:  30 capsule    Refill:  0  . DISCONTD: amphetamine-dextroamphetamine (ADDERALL XR) 30 MG 24 hr capsule    Sig: Take 1 capsule (30 mg total) by mouth every morning.    Dispense:  30 capsule    Refill:  0    Do not fill before April 26, 2013.  Marland Kitchen. amphetamine-dextroamphetamine (ADDERALL XR) 30 MG 24 hr capsule    Sig: Take 1 capsule (30 mg total) by mouth every morning.    Dispense:  30 capsule    Refill:  0    Do not fill before May 26, 2013.  Marland Kitchen. DISCONTD: amphetamine-dextroamphetamine (ADDERALL) 10 MG tablet    Sig: Take 1 tablet in the early afternoon or as directed.    Dispense:  30 tablet    Refill:  0  . DISCONTD: amphetamine-dextroamphetamine (ADDERALL) 10 MG tablet    Sig: Take 1 tablet in the early afternoon or as directed.    Dispense:  30 tablet    Refill:  0    Do  not fill before April 26, 2013.  Marland Kitchen amphetamine-dextroamphetamine (ADDERALL) 10 MG tablet    Sig: Take 1 tablet in the early afternoon or as directed.    Dispense:  30 tablet    Refill:  0    Do not fill before May 26, 2013.

## 2013-03-28 NOTE — Patient Instructions (Signed)
ADHD medication- continue taking ADDERALL XR 30 mg every morning. The ADDERALL 10 mg tablet should be taken around 2-3 pm in the afternoon to carry you through the evening hours. I will see you in 3 months to assess if this dosign regimen is working for you. If you have any  problems sleeping or feel like this regimen is not working well. Contact the clinic and we can discuss changes.

## 2013-04-20 ENCOUNTER — Encounter: Payer: BC Managed Care – PPO | Admitting: Family Medicine

## 2013-04-30 ENCOUNTER — Other Ambulatory Visit: Payer: Self-pay | Admitting: Family Medicine

## 2013-05-23 ENCOUNTER — Other Ambulatory Visit: Payer: Self-pay | Admitting: Family Medicine

## 2013-05-25 NOTE — Telephone Encounter (Signed)
Alprazolam 0.5 mg #30 w/ no RFs phoned to pt's pharmacy.

## 2013-06-23 ENCOUNTER — Other Ambulatory Visit: Payer: Self-pay | Admitting: Physician Assistant

## 2013-06-26 ENCOUNTER — Telehealth: Payer: Self-pay

## 2013-06-26 NOTE — Telephone Encounter (Signed)
PT CALLED IN STATED SHE COULD NOT MAKE HER APPOINTMENT ON Wednesday 06/28/13 DUE TO WORK HOURS, SHE WOULD LIKE TO BE RESCHEDULED FOR HER CPE.  PLEASE CALL HER BACK AT [phone]

## 2013-06-28 ENCOUNTER — Encounter: Payer: No Typology Code available for payment source | Admitting: Family Medicine

## 2013-06-28 NOTE — Telephone Encounter (Signed)
Notified check in at [facility] of patient cancelling appt.  Called pt to reschedule but had to leave message on VM to CB to reschedule.

## 2013-07-07 ENCOUNTER — Encounter: Payer: Self-pay | Admitting: Family Medicine

## 2013-07-07 ENCOUNTER — Ambulatory Visit (INDEPENDENT_AMBULATORY_CARE_PROVIDER_SITE_OTHER): Payer: No Typology Code available for payment source | Admitting: Family Medicine

## 2013-07-07 VITALS — BP 118/81 | HR 82 | Temp 98.5°F | Resp 18 | Ht 64.0 in | Wt 114.0 lb

## 2013-07-07 DIAGNOSIS — F909 Attention-deficit hyperactivity disorder, unspecified type: Secondary | ICD-10-CM

## 2013-07-07 MED ORDER — METOPROLOL SUCCINATE ER 100 MG PO TB24: 100.0000 mg | ORAL_TABLET | Freq: Every day | ORAL | Status: DC

## 2013-07-07 MED ORDER — AMPHETAMINE-DEXTROAMPHET ER 30 MG PO CP24: 30.0000 mg | ORAL_CAPSULE | ORAL | Status: DC

## 2013-07-07 MED ORDER — ALPRAZOLAM 0.5 MG PO TABS: ORAL_TABLET | ORAL | Status: DC

## 2013-07-07 MED ORDER — AMPHETAMINE-DEXTROAMPHETAMINE 10 MG PO TABS: ORAL_TABLET | ORAL | Status: DC

## 2013-07-08 NOTE — Progress Notes (Signed)
S:  This 33 y.o. Cauc female is here for ADHD follow-up. She is stable on current medication dose and reports no adverse effects. 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 Her employer has moved into a new facility which she is excited. She purchased a new vehicle and plans to go camping this summer. She has a good appetite, has restful sleep and has no behavior changes or agitation related to stimulant medication. She denies diaphoresis, fatigue, vision disturbances, palpitations, CP or tightness, SOB, GI problems, HA, dizziness, tremor or syncope.  Patient Active Problem List   Diagnosis Date Noted  . Sinus tachycardia 07/14/2012  . History of panic disorder 07/14/2012  . History of kidney stones 07/14/2012  . DDD (degenerative disc disease), lumbar 07/14/2012  . History of ovarian cyst 07/14/2012  . ADHD (attention deficit hyperactivity disorder) 07/12/2012   PMHx, Surg Hx, Socn ad Fam Hx reviewed.  MEDICATIONS reconciled.  ROS: As per HPI.  GEN: In NAD; WN,WD. HENT: [place]/AT; EOMI w/ clear conj/sclerae. Otherwise unremarkable. COR: RRR.  LUNGS: Normal resp rate and effort. SKIN: W&D; intact w/o diaphoresis, erythema or pallor. MS: MAEs; no deformities, c/c/e. NEURO: A&O x 3; CNs intact. Nonfocal.  A/P: Attention deficit hyperactivity disorder (ADHD), unspecified ADHD type  Meds ordered this encounter  Medications  . amphetamine-dextroamphetamine (ADDERALL) 10 MG tablet    Sig: Take 1 tablet in the early afternoon or as directed.    Dispense:  30 tablet    Refill:  0    May fill on or after August 06, 2013  . amphetamine-dextroamphetamine (ADDERALL XR) 30 MG 24 hr capsule    Sig: Take 1 capsule (30 mg total) by mouth every morning.    Dispense:  30 capsule    Refill:  0    May fill on or after September 06, 2013.  . metoprolol succinate (TOPROL-XL) 100 MG 24 hr tablet    Sig: Take 1 tablet (100 mg total) by mouth daily.    Dispense:  30 tablet    Refill:  5  .  ALPRAZolam (XANAX) 0.5 MG tablet    Sig: TAKE 1 TABLET BY MOUTH AT BEDTIME AS NEEDED FOR SLEEP    Dispense:  30 tablet    Refill:  1

## 2013-07-10 ENCOUNTER — Telehealth: Payer: Self-pay

## 2013-07-10 NOTE — Telephone Encounter (Signed)
Pt said her amphetamine-dextroamphetamine (ADDERALL XR) 30 MG 24 hr capsule prescription was written for the wrong date, says it was written for 7/16 not 6/26, would like to know if she could have this re written. Pt of Dr, Audria NineMcPherson

## 2013-07-11 MED ORDER — AMPHETAMINE-DEXTROAMPHETAMINE 10 MG PO TABS: ORAL_TABLET | ORAL | Status: DC

## 2013-07-11 MED ORDER — AMPHETAMINE-DEXTROAMPHET ER 30 MG PO CP24: 30.0000 mg | ORAL_CAPSULE | ORAL | Status: DC

## 2013-07-11 NOTE — Telephone Encounter (Signed)
Apparently, I forgot to give her prescriptions to get filled immediately. I will print out the ones that should have been printed for 07/07/13. Pt can pick it up at [address] on Wednesday. I called and left a message for the pt.

## 2013-07-31 ENCOUNTER — Telehealth: Payer: Self-pay | Admitting: Family Medicine

## 2013-07-31 NOTE — Telephone Encounter (Signed)
Dr. Audria NineMcPherson:  Patient called to cancel her CPE appt. For 08/01/13 and wants to know if you will go ahead and refill her prescriptions for xanax and Adderall based on the labs and urinalysis she had done at the hospital due to her Gall Bladder.  She is having the information sent over to us.  Patient's number is [phone]

## 2013-08-01 ENCOUNTER — Encounter: Payer: No Typology Code available for payment source | Admitting: Family Medicine

## 2013-08-01 MED ORDER — AMPHETAMINE-DEXTROAMPHET ER 30 MG PO CP24: 30.0000 mg | ORAL_CAPSULE | ORAL | Status: DC

## 2013-08-01 MED ORDER — AMPHETAMINE-DEXTROAMPHETAMINE 10 MG PO TABS: ORAL_TABLET | ORAL | Status: DC

## 2013-08-01 MED ORDER — ALPRAZOLAM 0.5 MG PO TABS: ORAL_TABLET | ORAL | Status: DC

## 2013-08-01 NOTE — Telephone Encounter (Signed)
I will refill these meds one time. Pt needs to re-schedule CPE. She can pick up RXs at [facility] on Wednesday; signature required.

## 2013-08-02 NOTE — Telephone Encounter (Signed)
LM for pt rx ready for pick up and needs to schedule appt.

## 2013-08-22 ENCOUNTER — Ambulatory Visit: Payer: No Typology Code available for payment source | Admitting: Family Medicine

## 2013-08-28 ENCOUNTER — Telehealth: Payer: Self-pay

## 2013-08-28 NOTE — Telephone Encounter (Signed)
Left message on machine explaining to pt we have not received her records yet, she may however come by our office to sign a release of information so we can request her records from [facility].

## 2013-08-28 NOTE — Telephone Encounter (Signed)
I do not see anything from [facility].  Nothing in the current paperwork we have.

## 2013-08-28 NOTE — Telephone Encounter (Signed)
Pt states she has a CPE scheduled with Dr. Audria NineMcPherson in September. Pt advises she was treated at [facility] last month and she requested to have all those records sent to our office. Pt wants to make sure that we received those records and wants to know if she will still need to do blood work at the time of the CPE, since they did blood work at the hospital,  she wants to know if that blood work will be sufficient for the CPE

## 2013-08-28 NOTE — Telephone Encounter (Signed)
Has the records been received on this patient? Please advise.

## 2013-09-12 ENCOUNTER — Telehealth: Payer: Self-pay

## 2013-09-12 MED ORDER — AMPHETAMINE-DEXTROAMPHETAMINE 10 MG PO TABS: ORAL_TABLET | ORAL | Status: DC

## 2013-09-12 MED ORDER — ALPRAZOLAM 0.5 MG PO TABS: ORAL_TABLET | ORAL | Status: DC

## 2013-09-12 MED ORDER — AMPHETAMINE-DEXTROAMPHET ER 30 MG PO CP24: 30.0000 mg | ORAL_CAPSULE | ORAL | Status: DC

## 2013-09-12 NOTE — Telephone Encounter (Signed)
Patient is requesting medication refill for zanax (generic), aderal, and blood pressure medication sent to [facility] pharmacy in [facility] on [address] (Patient wasn't sure of the name for bp medication).

## 2013-09-12 NOTE — Telephone Encounter (Signed)
Patient is calling to see if we received a fax that was sent from Dr. Alvester Morin about two weeks ago. Patient has records sent that are needed for the upcoming physical she has scheduled at [facility]. Please call patient once the fax is found [phone]

## 2013-09-12 NOTE — Telephone Encounter (Signed)
Pt has refills on Metoprolol medication through end of year. Xanax (Alprazolam) phoned to pharmacy. She has to pick up RX for Adderall; it cannot be phoned in. Adderall RXs  at [address] building; signature required.  I have not seen any forms that were faxed to my attention pertaining to this pt. Perhaps they are at [address]. Please check, Pt's appt is on 09/28/13.

## 2013-09-13 NOTE — Telephone Encounter (Signed)
Lm rx is ready at [address]. Advised pt to bring in a copy of the fax when she comes in to pick up scripts.

## 2013-09-19 NOTE — Telephone Encounter (Signed)
We received ultrasound results and lab results. They are scanned into Epic. Left voicemail for patient.

## 2013-09-21 ENCOUNTER — Telehealth: Payer: Self-pay

## 2013-09-21 NOTE — Telephone Encounter (Signed)
Alprazolam was phoned to pt's pharmacy on 09/12/2013. No additional refills at this time.  Lab results provided from ED visit in July are sufficient. She will not have any labs drawn.

## 2013-09-21 NOTE — Telephone Encounter (Signed)
Patient is requesting refill on ALPRAZolam Prudy Feeler) 0.5 MG tablet   Also,  Wants to know if the tests she had done will suffice for her CPE scheduled with Dr.  Audria Nine on 9/17.  Does she need to fast for labwork?  [phone]

## 2013-09-28 ENCOUNTER — Encounter: Payer: Self-pay | Admitting: Family Medicine

## 2013-09-28 ENCOUNTER — Ambulatory Visit (INDEPENDENT_AMBULATORY_CARE_PROVIDER_SITE_OTHER): Payer: No Typology Code available for payment source | Admitting: Family Medicine

## 2013-09-28 VITALS — BP 126/74 | HR 84 | Temp 97.9°F | Resp 16 | Ht 63.5 in | Wt 116.0 lb

## 2013-09-28 DIAGNOSIS — F909 Attention-deficit hyperactivity disorder, unspecified type: Secondary | ICD-10-CM

## 2013-09-28 DIAGNOSIS — F902 Attention-deficit hyperactivity disorder, combined type: Secondary | ICD-10-CM

## 2013-09-28 DIAGNOSIS — Z0000 Encounter for general adult medical examination without abnormal findings: Secondary | ICD-10-CM

## 2013-09-28 DIAGNOSIS — Z87442 Personal history of urinary calculi: Secondary | ICD-10-CM

## 2013-09-28 DIAGNOSIS — Z23 Encounter for immunization: Secondary | ICD-10-CM

## 2013-09-28 LAB — COMPLETE METABOLIC PANEL WITH GFR
ALT: 11 U/L (ref 0–35)
AST: 11 U/L (ref 0–37)
Albumin: 4.3 g/dL (ref 3.5–5.2)
Alkaline Phosphatase: 41 U/L (ref 39–117)
BUN: 17 mg/dL (ref 6–23)
CALCIUM: 9.3 mg/dL (ref 8.4–10.5)
CO2: 25 meq/L (ref 19–32)
Chloride: 105 mEq/L (ref 96–112)
Creat: 0.82 mg/dL (ref 0.50–1.10)
GFR, Est African American: >89 mL/min
GFR, Est Non African American: >89 mL/min
GLUCOSE: 84 mg/dL (ref 70–99)
POTASSIUM: 4.2 meq/L (ref 3.5–5.3)
SODIUM: 138 meq/L (ref 135–145)
TOTAL PROTEIN: 6.4 g/dL (ref 6.0–8.3)
Total Bilirubin: 0.4 mg/dL (ref 0.2–1.2)

## 2013-09-28 LAB — POCT URINALYSIS DIPSTICK
Bilirubin, UA: NEGATIVE
Blood, UA: NEGATIVE
GLUCOSE UA: NEGATIVE
Ketones, UA: NEGATIVE
LEUKOCYTES UA: NEGATIVE
NITRITE UA: NEGATIVE
PROTEIN UA: NEGATIVE
Spec Grav, UA: 1.01
UROBILINOGEN UA: 0.2
pH, UA: 5.5

## 2013-09-28 LAB — CBC WITH DIFFERENTIAL/PLATELET
BASOS ABS: 0 10*3/uL (ref 0.0–0.1)
Basophils Relative: 0 % (ref 0–1)
Eosinophils Absolute: 0.4 10*3/uL (ref 0.0–0.7)
Eosinophils Relative: 4 % (ref 0–5)
HCT: 34.7 % — ABNORMAL LOW (ref 36.0–46.0)
Hemoglobin: 11.3 g/dL — ABNORMAL LOW (ref 12.0–15.0)
LYMPHS PCT: 29 % (ref 12–46)
Lymphs Abs: 2.6 10*3/uL (ref 0.7–4.0)
MCH: 30.2 pg (ref 26.0–34.0)
MCHC: 32.6 g/dL (ref 30.0–36.0)
MCV: 92.8 fL (ref 78.0–100.0)
Monocytes Absolute: 0.4 10*3/uL (ref 0.1–1.0)
Monocytes Relative: 5 % (ref 3–12)
NEUTROS ABS: 5.5 10*3/uL (ref 1.7–7.7)
NEUTROS PCT: 62 % (ref 43–77)
PLATELETS: 334 10*3/uL (ref 150–400)
RBC: 3.74 MIL/uL — ABNORMAL LOW (ref 3.87–5.11)
RDW: 12.6 % (ref 11.5–15.5)
WBC: 8.9 10*3/uL (ref 4.0–10.5)

## 2013-09-28 LAB — LIPID PANEL
Cholesterol: 173 mg/dL (ref 0–200)
HDL: 67 mg/dL (ref >39)
LDL Cholesterol: 93 mg/dL (ref 0–99)
Total CHOL/HDL Ratio: 2.6 Ratio
Triglycerides: 65 mg/dL (ref <150)
VLDL: 13 mg/dL (ref 0–40)

## 2013-09-28 MED ORDER — AMPHETAMINE-DEXTROAMPHETAMINE 10 MG PO TABS: ORAL_TABLET | ORAL | Status: DC

## 2013-09-28 MED ORDER — AMPHETAMINE-DEXTROAMPHET ER 30 MG PO CP24: 30.0000 mg | ORAL_CAPSULE | ORAL | Status: DC

## 2013-09-28 MED ORDER — ALPRAZOLAM 0.5 MG PO TABS: ORAL_TABLET | ORAL | Status: DC

## 2013-09-28 NOTE — Patient Instructions (Addendum)
Influenza Vaccine (Flu Vaccine, Inactivated or Recombinant) 2014-2015: What You Need to Know 1. Why get vaccinated? Influenza ("flu") is a contagious disease that spreads around the [place] every winter, usually between October and May. Flu is caused by influenza viruses, and is spread mainly by coughing, sneezing, and close contact. Anyone can get flu, but the risk of getting flu is highest among children. Symptoms come on suddenly and may last several days. They can include:  fever/chills  sore throat  muscle aches  fatigue  cough  headache  runny or stuffy nose Flu can make some people much sicker than others. These people include young children, people 65 and older, pregnant women, and people with certain health conditions-such as heart, lung or kidney disease, nervous system disorders, or a weakened immune system. Flu vaccination is especially important for these people, and anyone in close contact with them. Flu can also lead to pneumonia, and make existing medical conditions worse. It can cause diarrhea and seizures in children. Each year thousands of people in the [place] die from flu, and many more are hospitalized. Flu vaccine is the best protection against flu and its complications. Flu vaccine also helps prevent spreading flu from person to person. 2. Inactivated and recombinant flu vaccines You are getting an injectable flu vaccine, which is either an "inactivated" or "recombinant" vaccine. These vaccines do not contain any live influenza virus. They are given by injection with a needle, and often called the "flu shot."  A different live, attenuated (weakened) influenza vaccine is sprayed into the nostrils. This vaccine is described in a separate Vaccine Information Statement. Flu vaccination is recommended every year. Some children 6 months through 8 years of age might need two doses during one year. Flu viruses are always changing. Each year's flu vaccine is made  to protect against 3 or 4 viruses that are likely to cause disease that year. Flu vaccine cannot prevent all cases of flu, but it is the best defense against the disease.  It takes about 2 weeks for protection to develop after the vaccination, and protection lasts several months to a year. Some illnesses that are not caused by influenza virus are often mistaken for flu. Flu vaccine will not prevent these illnesses. It can only prevent influenza. Some inactivated flu vaccine contains a very small amount of a mercury-based preservative called thimerosal. Studies have shown that thimerosal in vaccines is not harmful, but flu vaccines that do not contain a preservative are available. 3. Some people should not get this vaccine Tell the person who gives you the vaccine:  If you have any severe, life-threatening allergies. If you ever had a life-threatening allergic reaction after a dose of flu vaccine, or have a severe allergy to any part of this vaccine, including (for example) an allergy to gelatin, antibiotics, or eggs, you may be advised not to get vaccinated. Most, but not all, types of flu vaccine contain a small amount of egg protein.  If you ever had Guillain-Barr Syndrome (a severe paralyzing illness, also called GBS). Some people with a history of GBS should not get this vaccine. This should be discussed with your doctor.  If you are not feeling well. It is usually okay to get flu vaccine when you have a mild illness, but you might be advised to wait until you feel better. You should come back when you are better. 4. Risks of a vaccine reaction With a vaccine, like any medicine, there is a chance of side   effects. These are usually mild and go away on their own. Problems that could happen after any vaccine:  Brief fainting spells can happen after any medical procedure, including vaccination. Sitting or lying down for about 15 minutes can help prevent fainting, and injuries caused by a fall. Tell  your doctor if you feel dizzy, or have vision changes or ringing in the ears.  Severe shoulder pain and reduced range of motion in the arm where a shot was given can happen, very rarely, after a vaccination.  Severe allergic reactions from a vaccine are very rare, estimated at less than 1 in a million doses. If one were to occur, it would usually be within a few minutes to a few hours after the vaccination. Mild problems following inactivated flu vaccine:  soreness, redness, or swelling where the shot was given  hoarseness  sore, red or itchy eyes  cough  fever  aches  headache  itching  fatigue If these problems occur, they usually begin soon after the shot and last 1 or 2 days. Moderate problems following inactivated flu vaccine:  Young children who get inactivated flu vaccine and pneumococcal vaccine (PCV13) at the same time may be at increased risk for seizures caused by fever. Ask your doctor for more information. Tell your doctor if a child who is getting flu vaccine has ever had a seizure. Inactivated flu vaccine does not contain live flu virus, so you cannot get the flu from this vaccine. As with any medicine, there is a very remote chance of a vaccine causing a serious injury or death. The safety of vaccines is always being monitored. For more information, visit: www.cdc.gov/vaccinesafety/ 5. What if there is a serious reaction? What should I look for?  Look for anything that concerns you, such as signs of a severe allergic reaction, very high fever, or behavior changes. Signs of a severe allergic reaction can include hives, swelling of the face and throat, difficulty breathing, a fast heartbeat, dizziness, and weakness. These would start a few minutes to a few hours after the vaccination. What should I do?  If you think it is a severe allergic reaction or other emergency that can't wait, call 9-1-1 and get the person to the nearest hospital. Otherwise, call your  doctor.  Afterward, the reaction should be reported to the Vaccine Adverse Event Reporting System (VAERS). Your doctor should file this report, or you can do it yourself through the VAERS web site at www.vaers.hhs.gov, or by calling 1-800-822-7967. VAERS does not give medical advice. 6. The National Vaccine Injury Compensation Program The National Vaccine Injury Compensation Program (VICP) is a federal program that was created to compensate people who may have been injured by certain vaccines. Persons who believe they may have been injured by a vaccine can learn about the program and about filing a claim by calling 1-800-338-2382 or visiting the VICP website at www.hrsa.gov/vaccinecompensation. There is a time limit to file a claim for compensation. 7. How can I learn more?  Ask your health care provider.  Call your local or state health department.  Contact the Centers for Disease Control and Prevention (CDC):  Call 1-800-232-4636 (1-800-CDC-INFO) or  Visit CDC's website at www.cdc.gov/flu CDC Vaccine Information Statement (Interim) Inactivated Influenza Vaccine (08/30/2012) Document Released: 10/23/2005 Document Revised: 05/15/2013 Document Reviewed: 12/16/2012 ExitCare Patient Information 2015 [employer]. This information is not intended to replace advice given to you by your health care provider. Make sure you discuss any questions you have with your health   care provider.    Keeping You Healthy  Get These Tests 1. Blood Pressure- Have your blood pressure checked once a year by your health care provider.  Normal blood pressure is 120/80. 2. Weight- Have your body mass index (BMI) calculated to screen for obesity.  BMI is measure of body fat based on height and weight.  You can also calculate your own BMI at https://www.west-esparza.com/. 3. Cholesterol- Have your cholesterol checked every 5 years starting at age 31 then yearly starting at age 48. 4. Chlamydia, HIV, and other  sexually transmitted diseases- Get screened every year until age 16, then within three months of each new sexual provider. 5. Pap Smear- Every 1-3 years; discuss with your health care provider. 6. Mammogram- Every year starting at age 58  Take these medicines  Calcium with Vitamin D-Your body needs 1200 mg of Calcium each day and [phone] IU of Vitamin D daily.  Your body can only absorb 500 mg of Calcium at a time so Calcium must be taken in 2 or 3 divided doses throughout the day.  Multivitamin with folic acid- Once daily if it is possible for you to become pregnant.  Get these Immunizations  Gardasil-Series of three doses; prevents HPV related illness such as genital warts and cervical cancer.  Menactra-Single dose; prevents meningitis.  Tetanus shot- Every 10 years.  Flu shot-Every year.  Take these steps 1. Do not smoke-Your healthcare provider can help you quit.  For tips on how to quit go to www.smokefree.gov or call 1-800 QUITNOW. 2. Be physically active- Exercise 5 days a week for at least 30 minutes.  If you are not already physically active, start slow and gradually work up to 30 minutes of moderate physical activity.  Examples of moderate activity include walking briskly, dancing, swimming, bicycling, etc. 3. Breast Cancer- A self breast exam every month is important for early detection of breast cancer.  For more information and instruction on self breast exams, ask your healthcare provider or [email or web address]. 4. Eat a healthy diet- Eat a variety of healthy foods such as fruits, vegetables, whole grains, low fat milk, low fat cheeses, yogurt, lean meats, poultry and fish, beans, nuts, tofu, etc.  For more information go to www. Thenutritionsource.org 5. Drink alcohol in moderation- Limit alcohol intake to one drink or less per day. Never drink and drive. 6. Depression- Your emotional health is as important as your physical health.  If you're  feeling down or losing interest in things you normally enjoy please talk to your healthcare provider about being screened for depression. 7. Dental visit- Brush and floss your teeth twice daily; visit your dentist twice a year. 8. Eye doctor- Get an eye exam at least every 2 years. 9. Helmet use- Always wear a helmet when riding a bicycle, motorcycle, rollerblading or skateboarding. 10. Safe sex- If you may be exposed to sexually transmitted infections, use a condom. 11. Seat belts- Seat belts can save your live; always wear one. 12. Smoke/Carbon Monoxide detectors- These detectors need to be installed on the appropriate level of your home. Replace batteries at least once a year. 13. Skin cancer- When out in the sun please cover up and use sunscreen 15 SPF or higher. 14. Violence- If anyone is threatening or hurting you, please tell your healthcare provider.   You can call the clinic to get your next 44-month supply of prescriptions for Adderall. I will have to see you in 6 months.

## 2013-09-28 NOTE — Progress Notes (Signed)
Subjective:    Patient ID: Danielle [name], female    DOB: 20-Feb-1980, 33 y.o.   MRN: [id number]  HPI  This 33 y.o. Cauc female is here for CPE and labs. She has ADHD and hx of panic disorder. She is stable on current medications. 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 Recent illnesses included a kidney stone which was treated in another hospital system; pt is recovering from URI and has seasonal AR.  HCM: PAP- no record of recent PAP (declines today); same sex partner.           IMM- Current.    Patient Active Problem List   Diagnosis Date Noted  . Sinus tachycardia 07/14/2012  . History of panic disorder 07/14/2012  . History of kidney stones 07/14/2012  . DDD (degenerative disc disease), lumbar 07/14/2012  . History of ovarian cyst 07/14/2012  . ADHD (attention deficit hyperactivity disorder) 07/12/2012    Prior to Admission medications   Medication Sig Start Date End Date Taking? Authorizing Provider  ALPRAZolam (XANAX) 0.5 MG tablet TAKE 1 TABLET BY MOUTH AT BEDTIME AS NEEDED FOR SLEEP    Maurice March, MD  amphetamine-dextroamphetamine (ADDERALL XR) 30 MG 24 hr capsule Take 1 capsule (30 mg total) by mouth every morning.    Maurice March, MD  amphetamine-dextroamphetamine (ADDERALL) 10 MG tablet Take 1 tablet in the early afternoon or as directed.    Maurice March, MD  metoprolol succinate (TOPROL-XL) 100 MG 24 hr tablet Take 1 tablet (100 mg total) by mouth daily.    Maurice March, MD    History   Social History  . Marital Status: Single    Spouse Name: N/A    Number of Children: N/A  . Years of Education: N/A   Occupational History  . Not on file.   Social History Main Topics  . Smoking status: Former Smoker -- 0.50 packs/day    Types: Cigarettes  . Smokeless tobacco: Never Used  . Alcohol Use: Yes  . Drug Use: No  . Sexual Activity: No   Other Topics Concern  . Not on file    Social History Narrative   Marital: single; dating seriously; same sex partner.      Children: none      Lives with: with girlfriend.      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      Exercise: no formal exercise.    Review of Systems  Constitutional: Negative.   HENT: Positive for congestion, rhinorrhea and sore throat.   Eyes: Negative.   Respiratory: Negative.   Cardiovascular: Negative.   Gastrointestinal: Negative.   Endocrine: Negative.   Genitourinary: Negative.   Musculoskeletal: Negative.   Skin: Negative.   Allergic/Immunologic: Positive for environmental allergies.  Neurological: Negative.   Hematological: Negative.   Psychiatric/Behavioral: Negative for behavioral problems, sleep disturbance, dysphoric mood, decreased concentration and agitation. The patient is nervous/anxious and is hyperactive.        Objective:   Physical Exam  Nursing note and vitals reviewed. Constitutional: She is oriented to person, place, and time. Vital signs are normal. She appears well-developed and well-nourished. No distress.  HENT:  Head: Normocephalic and atraumatic.  Right Ear: Hearing, tympanic membrane and external ear normal.  Left Ear: Hearing, tympanic membrane, external ear and ear canal normal.  Nose: Nose normal. No nasal deformity or septal deviation.  Mouth/Throat: Uvula is midline, oropharynx is clear and moist  and mucous membranes are normal. No oral lesions. Normal dentition. No dental caries. No posterior oropharyngeal erythema.  Eyes: Conjunctivae, EOM and lids are normal. Pupils are equal, round, and reactive to light. No scleral icterus.  Fundoscopic exam:      The right eye shows no arteriolar narrowing, no AV nicking and no papilledema. The right eye shows red reflex.       The left eye shows no arteriolar narrowing, no AV nicking and no papilledema. The left eye shows red reflex.  Neck: Trachea normal, normal range of motion, full passive range of  motion without pain and phonation normal. Neck supple. No spinous process tenderness and no muscular tenderness present. No mass and no thyromegaly present.  Cardiovascular: Normal rate, regular rhythm, S1 normal, S2 normal, normal heart sounds and normal pulses.   No extrasystoles are present. PMI is not displaced.  Exam reveals no gallop and no friction rub.   No murmur heard. Pulmonary/Chest: Effort normal and breath sounds normal. No respiratory distress. She has no decreased breath sounds. She has no wheezes.  Abdominal: Soft. Normal appearance and bowel sounds are normal. She exhibits no distension and no mass. There is no hepatosplenomegaly. There is no tenderness. There is no guarding and no CVA tenderness.  Genitourinary:  Deferred.  Musculoskeletal:       Cervical back: Normal.       Thoracic back: Normal.       Lumbar back: Normal.  Remainder of exam unremarkable.  Lymphadenopathy:       Head (right side): No submental, no submandibular, no tonsillar, no preauricular, no posterior auricular and no occipital adenopathy present.       Head (left side): No submental, no submandibular, no tonsillar, no preauricular, no posterior auricular and no occipital adenopathy present.    She has no cervical adenopathy.       Right: No inguinal and no supraclavicular adenopathy present.       Left: No inguinal and no supraclavicular adenopathy present.  Neurological: She is alert and oriented to person, place, and time. She has normal strength and normal reflexes. She displays no atrophy. No cranial nerve deficit or sensory deficit. She exhibits normal muscle tone. Coordination and gait normal.  Skin: Skin is warm, dry and intact. No ecchymosis, no lesion and no rash noted. She is not diaphoretic. No cyanosis or erythema. No pallor. Nails show no clubbing.  Psychiatric: She has a normal mood and affect. Her speech is normal and behavior is normal. Judgment and thought content normal. Cognition and  memory are normal.     Results for orders placed in visit on 09/28/13  POCT URINALYSIS DIPSTICK      Result Value Ref Range   Color, UA yellow     Clarity, UA clear     Glucose, UA neg     Bilirubin, UA neg     Ketones, UA neg     Spec Grav, UA 1.010     Blood, UA neg     pH, UA 5.5     Protein, UA neg     Urobilinogen, UA 0.2     Nitrite, UA neg     Leukocytes, UA Negative        Assessment & Plan:  Routine general medical examination at a health care facility - Plan: POCT urinalysis dipstick, Vit D  25 hydroxy (rtn osteoporosis monitoring), CBC with Differential, Lipid panel, COMPLETE METABOLIC PANEL WITH GFR  Attention deficit hyperactivity disorder (ADHD), combined type- Stable  on current medications. Continue same. Medication refills x 3 months. Pt to call clinic for next 38-month refill.  Must schedule OV in 6 months.  Need for prophylactic vaccination and inoculation against influenza - Plan: Flu Vaccine QUAD 36+ mos IM  History of kidney stones - Plan: POCT urinalysis dipstick, CBC with Differential, COMPLETE METABOLIC PANEL WITH GFR   Meds ordered this encounter  Medications  . ALPRAZolam (XANAX) 0.5 MG tablet    Sig: TAKE 1 TABLET BY MOUTH AT BEDTIME AS NEEDED FOR SLEEP    Dispense:  30 tablet    Refill:  2    May fill on or after Oct 12, 2013.  Marland Kitchen DISCONTD: amphetamine-dextroamphetamine (ADDERALL XR) 30 MG 24 hr capsule    Sig: Take 1 capsule (30 mg total) by mouth every morning.    Dispense:  30 capsule    Refill:  0    May fill on or after Oct 12, 2013.  Marland Kitchen DISCONTD: amphetamine-dextroamphetamine (ADDERALL) 10 MG tablet    Sig: Take 1 tablet in the early afternoon or as directed.    Dispense:  30 tablet    Refill:  0    May fill on or after Oct 12, 2013.  Marland Kitchen DISCONTD: amphetamine-dextroamphetamine (ADDERALL XR) 30 MG 24 hr capsule    Sig: Take 1 capsule (30 mg total) by mouth every morning.    Dispense:  30 capsule    Refill:  0    May fill on or after Nov 12, 2013.  Marland Kitchen DISCONTD: amphetamine-dextroamphetamine (ADDERALL) 10 MG tablet    Sig: Take 1 tablet in the early afternoon or as directed.    Dispense:  30 tablet    Refill:  0    May fill on or after Nov 12, 2013.  Marland Kitchen amphetamine-dextroamphetamine (ADDERALL XR) 30 MG 24 hr capsule    Sig: Take 1 capsule (30 mg total) by mouth every morning.    Dispense:  30 capsule    Refill:  0    May fill on or after Dec 12, 2013.  Marland Kitchen amphetamine-dextroamphetamine (ADDERALL) 10 MG tablet    Sig: Take 1 tablet in the early afternoon or as directed.    Dispense:  30 tablet    Refill:  0    May fill on or after Dec 12, 2013.

## 2013-09-29 LAB — VITAMIN D 25 HYDROXY (VIT D DEFICIENCY, FRACTURES): VIT D 25 HYDROXY: 40 ng/mL (ref 30–89)

## 2013-09-29 NOTE — Progress Notes (Signed)
Quick Note:  Please advise pt regarding following labs...  Your lab results show a mild anemia. Try to eat more foods that are rich in iron; get an over-the-counter multivitamin for young women that has iron in it. Take it daily with orange juice and a snack for better absorption.  All other labs look great.  Copy to pt. ______

## 2013-10-11 ENCOUNTER — Encounter: Payer: Self-pay | Admitting: Radiology

## 2013-10-11 ENCOUNTER — Telehealth: Payer: Self-pay | Admitting: Radiology

## 2013-10-11 NOTE — Telephone Encounter (Signed)
Message copied by Caffie DammeLITTRELL, AMY W on Wed Oct 11, 2013  2:23 PM ------      Message from: Johnnette LitterCARDWELL, ERIN M      Created: Sat Sep 30, 2013  2:28 PM                   ----- Message -----         From: Maurice MarchBarbara B McPherson, MD         Sent: 09/29/2013   6:44 PM           To: [facility] Lab Pool            Please advise pt regarding following labs...            Your lab results show a mild anemia.  Try to eat more foods that are rich in iron; get an over-the-counter multivitamin for young women that has iron in it. Take it daily with orange juice and a snack for better absorption.            All other labs look great.            Copy to pt. ------

## 2013-10-11 NOTE — Telephone Encounter (Signed)
Called patient. Left message. Patient is in my chart. Message sent to her to send message if she has questions

## 2014-01-15 ENCOUNTER — Other Ambulatory Visit: Payer: Self-pay | Admitting: Family Medicine

## 2014-01-15 NOTE — Telephone Encounter (Signed)
Dr Audria Nine, you saw pt for CPE in Sept., but don't see this med discussed. OK to RF?

## 2014-01-16 ENCOUNTER — Telehealth: Payer: Self-pay

## 2014-01-16 MED ORDER — AMPHETAMINE-DEXTROAMPHET ER 30 MG PO CP24: 30.0000 mg | ORAL_CAPSULE | ORAL | Status: DC

## 2014-01-16 MED ORDER — AMPHETAMINE-DEXTROAMPHETAMINE 10 MG PO TABS: ORAL_TABLET | ORAL | Status: DC

## 2014-01-16 MED ORDER — ALPRAZOLAM 0.5 MG PO TABS: ORAL_TABLET | ORAL | Status: DC

## 2014-01-16 NOTE — Telephone Encounter (Signed)
Patient needs refills on the following:  amphetamine-dextroamphetamine (ADDERALL XR) 30 MG 24 hr capsule [[id number]][[id number]]  ALPRAZolam (XANAX) 0.5 MG tablet [[id number]][[id number]]   [phone]

## 2014-01-16 NOTE — Telephone Encounter (Signed)
Printed medication refills ar at [facility] [address] for pick-up on Wednesday, Jan 17, 2014.

## 2014-01-16 NOTE — Telephone Encounter (Signed)
Metoprolol refilled; pt takes this medication for treatment of sinus tachycardia.

## 2014-01-24 ENCOUNTER — Telehealth: Payer: Self-pay

## 2014-01-24 NOTE — Telephone Encounter (Signed)
Patient called checking status of prescriptions. Informed her that they are ready at [address] but she wants to make sure they are written for 3 months before she drives from [facility] to pick them up. Cb# [phone].

## 2014-01-25 NOTE — Telephone Encounter (Signed)
Lm for pt stating rx is for 3 months

## 2014-04-17 ENCOUNTER — Ambulatory Visit (INDEPENDENT_AMBULATORY_CARE_PROVIDER_SITE_OTHER): Payer: No Typology Code available for payment source | Admitting: Family Medicine

## 2014-04-17 ENCOUNTER — Encounter: Payer: Self-pay | Admitting: Family Medicine

## 2014-04-17 VITALS — BP 111/68 | HR 96 | Temp 98.0°F | Resp 16 | Ht 63.5 in | Wt 107.0 lb

## 2014-04-17 DIAGNOSIS — Z87442 Personal history of urinary calculi: Secondary | ICD-10-CM | POA: Diagnosis not present

## 2014-04-17 DIAGNOSIS — F902 Attention-deficit hyperactivity disorder, combined type: Secondary | ICD-10-CM

## 2014-04-17 DIAGNOSIS — R319 Hematuria, unspecified: Secondary | ICD-10-CM | POA: Diagnosis not present

## 2014-04-17 DIAGNOSIS — Z566 Other physical and mental strain related to work: Secondary | ICD-10-CM

## 2014-04-17 LAB — POCT URINALYSIS DIPSTICK
Bilirubin, UA: NEGATIVE
GLUCOSE UA: NEGATIVE
Ketones, UA: NEGATIVE
Leukocytes, UA: NEGATIVE
Nitrite, UA: NEGATIVE
PH UA: 6
Protein, UA: NEGATIVE
RBC UA: NEGATIVE
Urobilinogen, UA: 0.2

## 2014-04-17 MED ORDER — AMPHETAMINE-DEXTROAMPHETAMINE 10 MG PO TABS: ORAL_TABLET | ORAL | Status: DC

## 2014-04-17 MED ORDER — ALPRAZOLAM 0.5 MG PO TABS: ORAL_TABLET | ORAL | Status: DC

## 2014-04-17 MED ORDER — AMPHETAMINE-DEXTROAMPHET ER 30 MG PO CP24: 30.0000 mg | ORAL_CAPSULE | ORAL | Status: DC

## 2014-04-17 NOTE — Progress Notes (Signed)
S:  This 34 y.o. Female has ADHD, stable on Adderall XR 30 mg every morning with Adderall 10 mg at midday. 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 Other changes that have increased her stress- recently purchased a home in [place], [place] and promotion w/ increased job responsibilities. She has not problems w/ current Adderall dose; no problems w/ anorexia, diaphoresis, CP or palpitations, SOB, tremors, agitation or behavior problem. She takes Alprazolam 0.5 mg at bedtime for sleep.   Due to increased stress, she has tried Alprazolam 0.5 mg 1/2 tablet at work but finds whole tablet is more effective. This has been necessary because she feels "stresssed out" at work; symptoms of increased stress>> irritability, isolation from co-workers or limiting interaction.  She is happy that her parents are moving to [place] to be closer to her.  Pt has hx of kidney stones. She reports hematuria 1 week ago while at beach on vacation. Onset during menses but pain was different from menstrual cramping.The pain was located in her L groin area until a few days ago; the hematuria and pain have resolved. She thinks she passed a kidney stone.  Pt requests referral to dentist; she contact the practice in [place] and was advised that she would need a referral from her PCP. Pt has no specific problems but wants to get established since she now has dental insurance.  Patient Active Problem List   Diagnosis Date Noted  . Sinus tachycardia 07/14/2012  . History of panic disorder 07/14/2012  . History of kidney stones 07/14/2012  . DDD (degenerative disc disease), lumbar 07/14/2012  . History of ovarian cyst 07/14/2012  . ADHD (attention deficit hyperactivity disorder) 07/12/2012    Prior to Admission medications   Medication Sig Start Date End Date Taking? Authorizing Provider  ALPRAZolam Prudy Feeler) 0.5 MG tablet Take 1 tablet at bedtime.   Yes  Maurice March, MD  amphetamine-dextroamphetamine (ADDERALL XR) 30 MG 24 hr capsule Take 1 capsule (30 mg total) by mouth every morning.   Yes Maurice March, MD  amphetamine-dextroamphetamine (ADDERALL) 10 MG tablet Take 1 tablet in the early afternoon or as directed.   Yes Maurice March, MD  metoprolol succinate (TOPROL-XL) 100 MG 24 hr tablet TAKE 1 TABLET (100 MG TOTAL) BY MOUTH DAILY. 01/16/14  Yes Maurice March, MD    SURG, SOC and FAM HX reviewed.  ROS: As per HPI.  O: Filed Vitals:   04/17/14 1641  BP: 111/68  Pulse: 96  Temp: 98 F (36.7 C)  Resp: 16    GEN: In NAD; WN,WD. HENT: [place]/AT; EOMI w/ clear conj/sclerae. Otherwise unremarkable. COR: RRR. LUNGS: Normal resp arte and effort. SKIN: W&D; intact w/o diaphoresis, erythema or pallor. NEURO: A&O x 3; CNs intact. Nonfocal.  Results for orders placed or performed in visit on 04/17/14  POCT urinalysis dipstick  Result Value Ref Range   Color, UA Light Yellow    Clarity, UA Clear    Glucose, UA Negative    Bilirubin, UA Negative    Ketones, UA Negative    Spec Grav, UA <=1.005    Blood, UA Negative    pH, UA 6.0    Protein, UA Negative    Urobilinogen, UA 0.2    Nitrite, UA Negative    Leukocytes, UA Negative     A/P: Attention deficit hyperactivity disorder (ADHD), combined type- Continue current Adderall prescriptions (pt given  RXs for 6 months). She is advised to establish w/ a medical practice in [place] within next 3-4 months, well before she needs medication refills.  Hematuria - Resolved.       Plan: POCT urinalysis dipstick  History of kidney stones - Plan: POCT urinalysis dipstick  Stress at work- Increase Alprazolam to 0.5 mg  1 tab bid prn.

## 2014-04-17 NOTE — Patient Instructions (Signed)
You have 6 months of Adderall prescriptions. It is important that you get established with a primary care physician within the next 3-4 months, well before you need refills on these medications. You also have 4 months of Alprazolam refills ( for twice a day use as needed for anxiety). Perhaps you will not need to take it as often on the weekends. Send me the name of the dental practice that requires a referral and I can order that.   I wish you all the best; be well!

## 2014-06-01 ENCOUNTER — Telehealth: Payer: Self-pay

## 2014-06-01 DIAGNOSIS — Z0120 Encounter for dental examination and cleaning without abnormal findings: Secondary | ICD-10-CM

## 2014-06-01 NOTE — Telephone Encounter (Signed)
Patient is calling to leave a message for Dr. Audria NineMcPherson. She states she found PCP and the her appointment with that Dr has been postponed, therefore she needs medication refills. She also needs a Education officer, communitydentist referral for [facility]. Phone is  [phone]. Patient also needs medical records sent to Dr. Herbert Moorsberi at [facility]. Phone: [phone] fax:[phone]. She was informed to fill out a release and to fax it to us.  She would like a refill for her medication for the next 3 months. Medications are: xanax, aderral 10mg  and 30mg , metroprolol. Patient phone: [phone]

## 2014-06-03 NOTE — Telephone Encounter (Signed)
LMOM for patient to call back.

## 2014-06-04 ENCOUNTER — Other Ambulatory Visit: Payer: Self-pay | Admitting: Family Medicine

## 2014-06-04 MED ORDER — METOPROLOL SUCCINATE ER 100 MG PO TB24: ORAL_TABLET | ORAL | Status: DC

## 2014-06-04 NOTE — Telephone Encounter (Signed)
I will do dental referral. At last visit in early April 2016, pt received Adderall 10 mg and 30 mg printed refills to take her through August 2016. I cannot do any additional refills. Alprazolam was refilled in April with 3 additional refills; last refill should be picked up in August. I can refill the metoprolol and route it to her pharmacy ([facility] [place], [place]). If she can provide the exact date of her postponed appt w/ her new physician, I will consider one additional refill; that will get her through Sept 2016.

## 2014-06-04 NOTE — Telephone Encounter (Signed)
Would you provide 3 months of refills?

## 2014-06-05 NOTE — Telephone Encounter (Signed)
Left message for pt to call back  °

## 2014-06-05 NOTE — Telephone Encounter (Signed)
Pt advised message from Dr. Audria NineMcPherson. She will call back with the appt date and another dentist to get a referral.

## 2014-08-10 ENCOUNTER — Other Ambulatory Visit: Payer: Self-pay | Admitting: Family Medicine

## 2014-09-03 ENCOUNTER — Other Ambulatory Visit: Payer: Self-pay | Admitting: Family Medicine

## 2014-09-03 ENCOUNTER — Ambulatory Visit (INDEPENDENT_AMBULATORY_CARE_PROVIDER_SITE_OTHER): Payer: No Typology Code available for payment source | Admitting: Family Medicine

## 2014-09-03 ENCOUNTER — Ambulatory Visit (INDEPENDENT_AMBULATORY_CARE_PROVIDER_SITE_OTHER): Payer: No Typology Code available for payment source

## 2014-09-03 ENCOUNTER — Encounter: Payer: Self-pay | Admitting: Family Medicine

## 2014-09-03 VITALS — BP 121/79 | HR 67 | Temp 98.1°F | Resp 16 | Ht 64.0 in | Wt 111.0 lb

## 2014-09-03 DIAGNOSIS — F900 Attention-deficit hyperactivity disorder, predominantly inattentive type: Secondary | ICD-10-CM

## 2014-09-03 DIAGNOSIS — S60222A Contusion of left hand, initial encounter: Secondary | ICD-10-CM | POA: Diagnosis not present

## 2014-09-03 DIAGNOSIS — Z23 Encounter for immunization: Secondary | ICD-10-CM

## 2014-09-03 MED ORDER — AMPHETAMINE-DEXTROAMPHETAMINE 10 MG PO TABS: ORAL_TABLET | ORAL | Status: DC

## 2014-09-03 MED ORDER — AMPHETAMINE-DEXTROAMPHET ER 30 MG PO CP24: 30.0000 mg | ORAL_CAPSULE | ORAL | Status: DC

## 2014-09-03 NOTE — Progress Notes (Signed)
[facility] [address], [place] [address] [phone]  Date:  09/03/2014   Name:  Danielle [name]   DOB:  06-19-1980   MRN:  [id number]  PCP:  No primary care provider on file.    Chief Complaint: Establish Care; Medication Refill; and Hand Injury   History of Present Illness:  Danielle [name] is a 34 y.o. very pleasant female patient who presents with the following:  Here today for a medication refill and to discuss a hand injury- she was doing some lawn edging and hit her left hand with a mallet this past Friday. Today is Monday. She is left handed.  She notes some tenderness over the dorsal aspect of the lateral hand.  Would like an x-ray  At her last visit in April we increased her xanax to 0.5 BID, continued her current adderall dose.  xr 30 in the am and another 10 mg of IR mid-day. 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  Her current regimen of adderall and xanax seems to be working for her  She also takes metoprolol every morning  LMP was 3-4 weeks ago-states there is no chance of pregnancy.  She is otherwise well today She has rx for adderall written for September that she will fill in a couple of weeks  Patient Active Problem List   Diagnosis Date Noted  . Sinus tachycardia 07/14/2012  . History of panic disorder 07/14/2012  . History of kidney stones 07/14/2012  . DDD (degenerative disc disease), lumbar 07/14/2012  . History of ovarian cyst 07/14/2012  . ADHD (attention deficit hyperactivity disorder) 07/12/2012    Past Medical History  Diagnosis Date  . Allergy   . Anxiety   . Kidney stones   . Gall stones     Past Surgical History  Procedure Laterality Date  . Wisdom tooth extraction      Social History  Substance Use Topics  . Smoking status: Former Smoker -- 0.50 packs/day    Types: Cigarettes  . Smokeless tobacco: Never Used  . Alcohol Use: Yes    Family History   Problem Relation Age of Onset  . Hypertension Mother   . Hyperlipidemia Mother   . Heart disease Mother   . Hyperlipidemia Father   . Hyperlipidemia Brother   . Hyperlipidemia Brother     Allergies  Allergen Reactions  . Amoxicillin Rash  . Penicillins Rash    Medication list has been reviewed and updated.  Current Outpatient Prescriptions on File Prior to Visit  Medication Sig Dispense Refill  . ALPRAZolam (XANAX) 0.5 MG tablet Take 1 tablet at midday and 1 tablet at bedtime. 60 tablet 3  . amphetamine-dextroamphetamine (ADDERALL XR) 30 MG 24 hr capsule Take 1 capsule (30 mg total) by mouth every morning. 30 capsule 0  . amphetamine-dextroamphetamine (ADDERALL) 10 MG tablet Take 1 tablet in the early afternoon or as directed. 30 tablet 0  . metoprolol succinate (TOPROL-XL) 100 MG 24 hr tablet TAKE 1 TABLET (100 MG TOTAL) BY MOUTH DAILY. 30 tablet 5   No current facility-administered medications on file prior to visit.    Review of Systems:  As per HPI- otherwise negative.   Physical Examination: Filed Vitals:   09/03/14 1130  BP: 121/79  Pulse: 67  Temp: 98.1 F (36.7 C)  Resp: 16   Filed Vitals:   09/03/14 1130  Height: 5\' 4"  (1.626  m)  Weight: 111 lb (50.349 kg)   Body mass index is 19.04 kg/(m^2). Ideal Body Weight: Weight in (lb) to have BMI = 25: 145.3  GEN: WDWN, NAD, Non-toxic, A & O x 3, slim, looks well HEENT: Atraumatic, Normocephalic. Neck supple. No masses, No LAD. Ears and Nose: No external deformity. CV: RRR, No M/G/R. No JVD. No thrill. No extra heart sounds. PULM: CTA B, no wheezes, crackles, rhonchi. No retractions. No resp. distress. No accessory muscle use. EXTR: No c/c/e NEURO Normal gait.  PSYCH: Normally interactive. Conversant. Not depressed or anxious appearing.  Calm demeanor.  Left hand: tenderness and minimal swelling of the lateral left hand, over the 5th MC.  No bruise, able to move hand normally but has tenderness with making a  fist  [facility] reading (PRIMARY) by  Dr. Patsy Lager. Left hand: negative  LEFT HAND - COMPLETE 3+ VIEW  COMPARISON: None.  FINDINGS: Early degenerative changes over the radiocarpal joint and the scaphoid trapezium joint. No evidence of fracture dislocation. No erosive changes or abnormal bony alignment.  IMPRESSION: No acute findings. Assessment and Plan: Need for prophylactic vaccination and inoculation against influenza - Plan: Flu Vaccine QUAD 36+ mos IM  Contusion of left hand, initial encounter - Plan: DG Hand Complete Left  Attention deficit hyperactivity disorder (ADHD), predominantly inattentive type - Plan: amphetamine-dextroamphetamine (ADDERALL) 10 MG tablet, amphetamine-dextroamphetamine (ADDERALL XR) 30 MG 24 hr capsule, DISCONTINUED: amphetamine-dextroamphetamine (ADDERALL) 10 MG tablet, DISCONTINUED: amphetamine-dextroamphetamine (ADDERALL XR) 30 MG 24 hr capsule  Hand contusion, no evidence of fracture.  She is relieved, will let us know if not better soon Gave an rx for her adderall to fill in October and in November Plan face to face even in 6 months   Signed Abbe Amsterdam, MD

## 2014-09-03 NOTE — Patient Instructions (Signed)
Good to see you today!   I will let you know if there is any concern on your x-ray radiologist read Please see me in about 6 months for a recheck- you can contact me in 3 months for adderall refills

## 2014-09-04 NOTE — Telephone Encounter (Signed)
Faxed

## 2014-10-16 IMAGING — CR DG FOREARM 2V*L*
2 series · 2 of 2 positions shown · non-contrast
Comparison: None.

CLINICAL DATA: Arm pain

LEFT FOREARM - 2 VIEW

[AP]
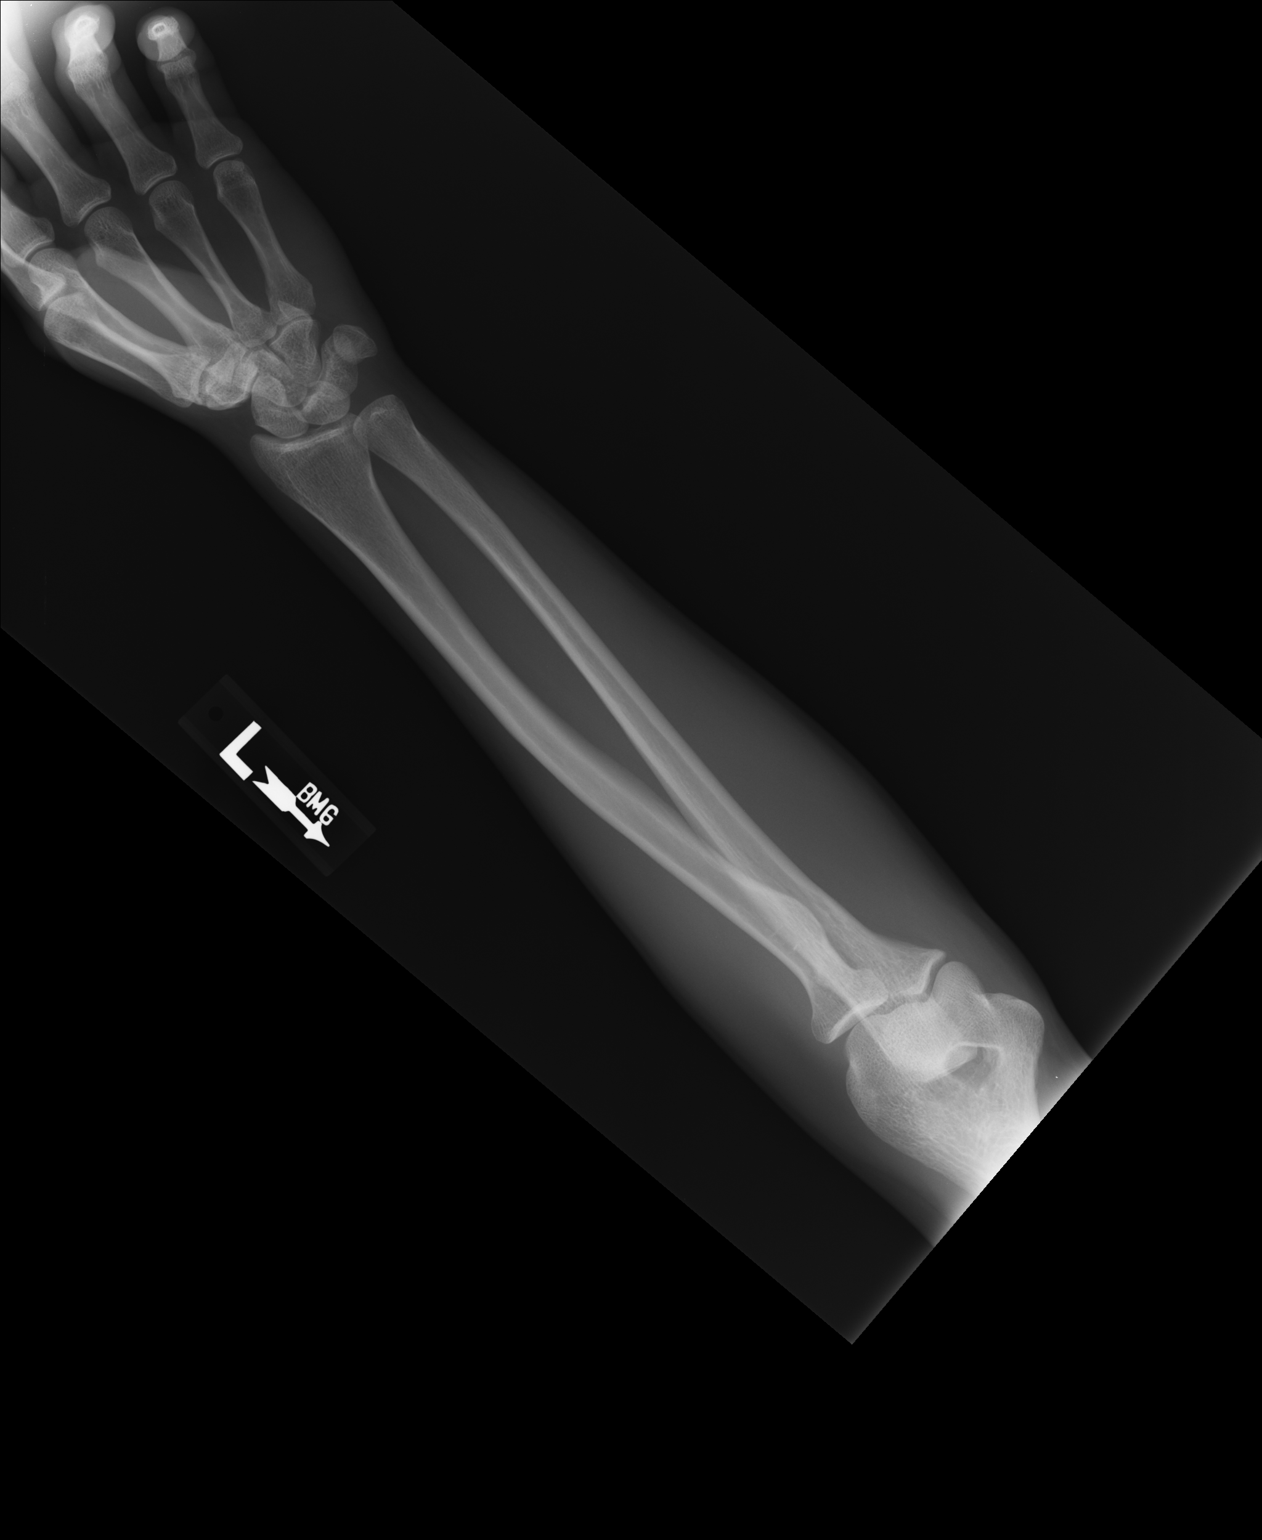

[lateral]
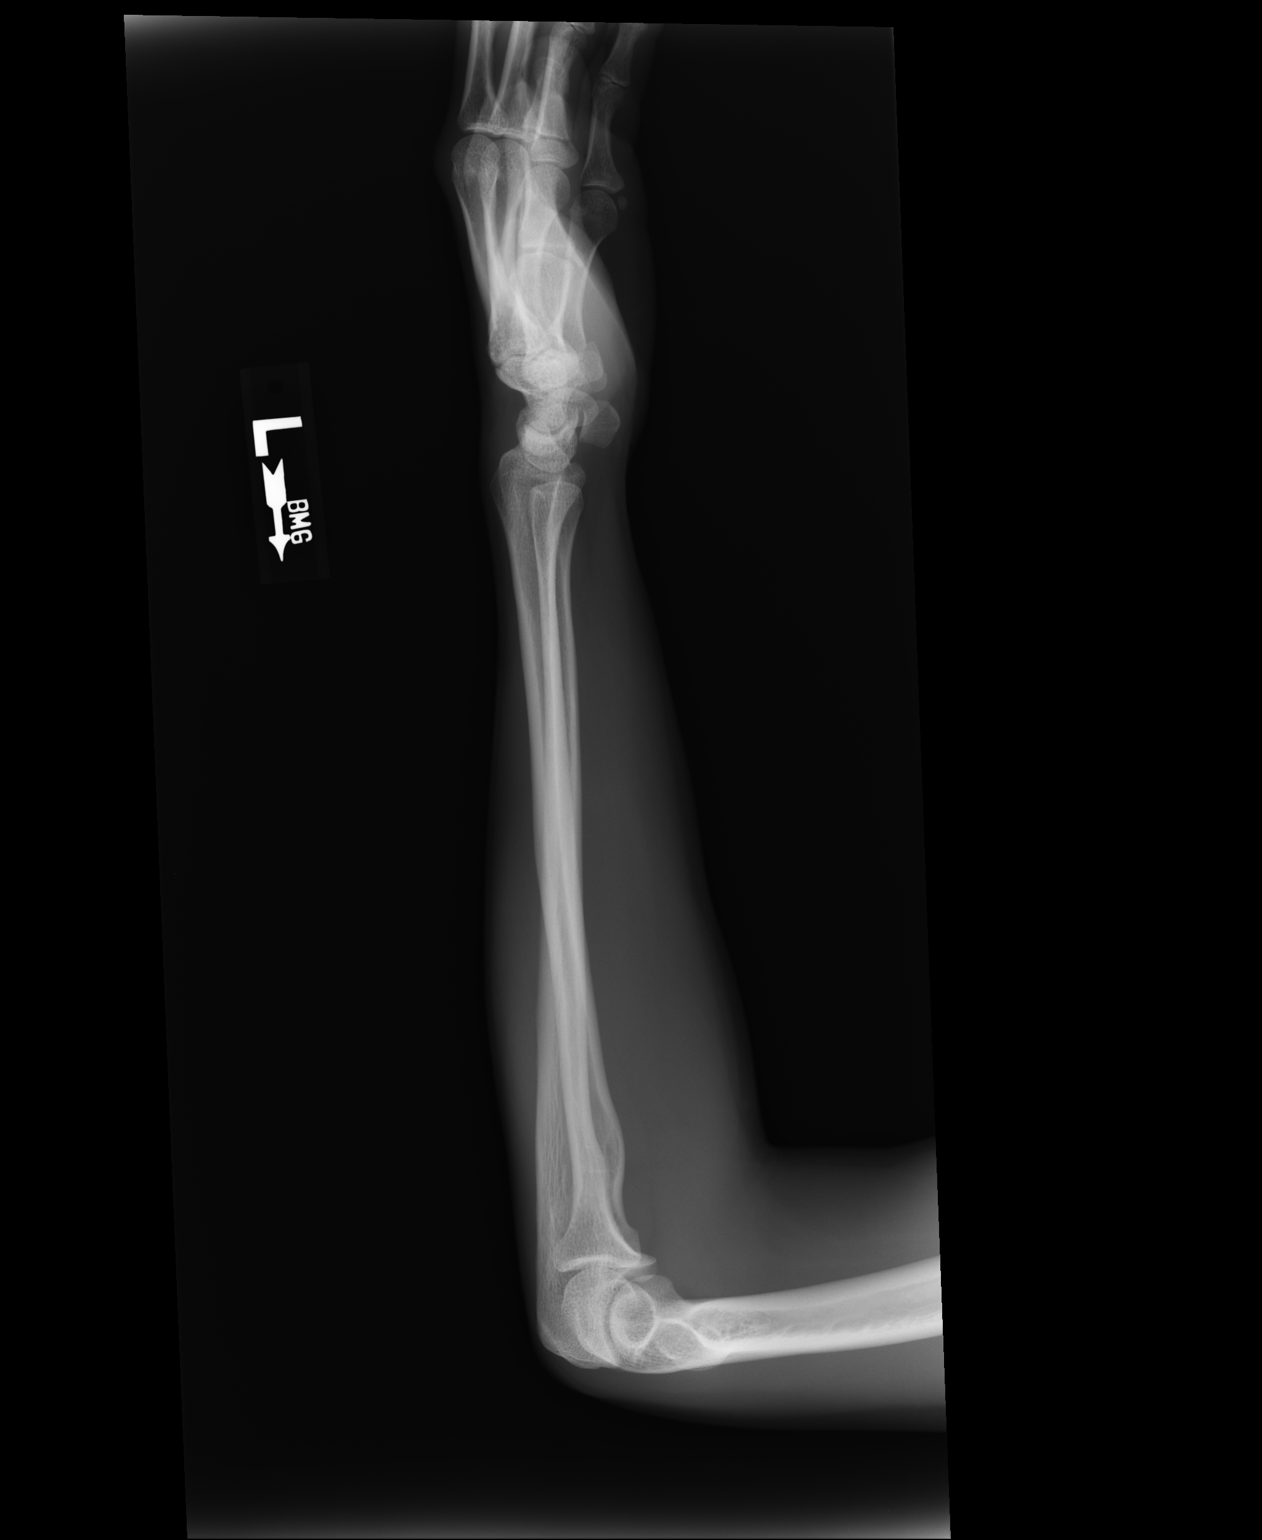

[2 of 2 positions shown; findings below may reference images not displayed]

FINDINGS: No fracture.  Limited visualization of the adjacent wrist and elbow
is normal.  No definite elbow joint effusion.  Regional soft
tissues are normal.  No radiopaque foreign body.
IMPRESSION: No fracture.

Clinically significant discrepancy from primary report, if
provided: None

## 2014-12-18 ENCOUNTER — Other Ambulatory Visit: Payer: Self-pay | Admitting: Family Medicine

## 2014-12-18 DIAGNOSIS — F900 Attention-deficit hyperactivity disorder, predominantly inattentive type: Secondary | ICD-10-CM

## 2014-12-19 ENCOUNTER — Other Ambulatory Visit: Payer: Self-pay

## 2014-12-19 DIAGNOSIS — F900 Attention-deficit hyperactivity disorder, predominantly inattentive type: Secondary | ICD-10-CM

## 2014-12-19 MED ORDER — AMPHETAMINE-DEXTROAMPHET ER 30 MG PO CP24: 30.0000 mg | ORAL_CAPSULE | ORAL | Status: DC

## 2014-12-19 NOTE — Addendum Note (Signed)
Addended by: Sheppard PlumberBRIGGS, Seniyah Esker A on: 12/19/2014 04:49 PM   Modules accepted: Orders

## 2014-12-19 NOTE — Telephone Encounter (Signed)
Please give her #30 of her adderall xr 30 mg while I am out of town.  Thank you!

## 2014-12-19 NOTE — Telephone Encounter (Signed)
LMOM that rx is ready for pickup only for 30 day supply.  Dr. Patsy Lageropland is out of the office.  I left on the VM that she should be hearing from us about her other medications tom.

## 2014-12-19 NOTE — Telephone Encounter (Signed)
Can someone please write this for Dr Patsy Lageropland? Pended.

## 2014-12-19 NOTE — Telephone Encounter (Signed)
Patient called to request refills on all meds (3 month supply) of adderall 30mg , adderall 10mg , alprazolam and metoprolol. Cb# [phone].

## 2014-12-20 MED ORDER — METOPROLOL SUCCINATE ER 100 MG PO TB24: ORAL_TABLET | ORAL | Status: DC

## 2014-12-20 MED ORDER — ALPRAZOLAM 0.5 MG PO TABS: ORAL_TABLET | ORAL | Status: DC

## 2014-12-20 MED ORDER — AMPHETAMINE-DEXTROAMPHETAMINE 10 MG PO TABS: ORAL_TABLET | ORAL | Status: DC

## 2014-12-20 NOTE — Telephone Encounter (Signed)
Pharm request was only for 30 mg Adderall. I have sent in a 90 day supply of metoprolol. Can someone else please write RFs of Adderall 10 mg and alprazolam? Dr Patsy Lageropland OKd the Adderall 30 in 12/6 Refill message. Pt due for f/up in Feb.  ALSO, PT LM ON MY VM ASKING HER CONTROLLED RXS BE MAILED TO HER AT [address], [place] [id number].  Adderall 30 Rx is in drawer currently.

## 2014-12-20 NOTE — Telephone Encounter (Signed)
Spoke to pt today and confirmed that she would be coming into [facility] today to pick up her ADDERALL 30 mg Rx. Pt would still like the 10mg  Adderall, alprazolam, and metoprolol to be mailed to her home once they are approved. [address], [place] [id number]

## 2014-12-21 NOTE — Telephone Encounter (Signed)
Other Rxs mailed.

## 2014-12-21 NOTE — Telephone Encounter (Signed)
Pt picked this up, and I mailed her other Rxs per her request.

## 2015-01-15 ENCOUNTER — Other Ambulatory Visit: Payer: Self-pay | Admitting: Physician Assistant

## 2015-01-17 ENCOUNTER — Telehealth: Payer: Self-pay

## 2015-01-17 DIAGNOSIS — F900 Attention-deficit hyperactivity disorder, predominantly inattentive type: Secondary | ICD-10-CM

## 2015-01-17 NOTE — Telephone Encounter (Signed)
Patient requests refills on her 30mg  Adderall that was approved by her insurance yesterday.  If possible, she requests that a PA or another doctor signs off on Dr SLM CorporationCopland's behalf, since Dr Patsy Lageropland will not be back in the office until Monday.  She requests for the printed script to be mailed to her address at [address] [place] [id number].  CB#: [phone]

## 2015-01-17 NOTE — Telephone Encounter (Signed)
PA approved for generic adderall 10 mg, 1 Qpm., case # [id number]. Notified pharm.

## 2015-01-18 MED ORDER — AMPHETAMINE-DEXTROAMPHETAMINE 10 MG PO TABS: ORAL_TABLET | ORAL | Status: DC

## 2015-01-18 MED ORDER — AMPHETAMINE-DEXTROAMPHET ER 30 MG PO CP24: 30.0000 mg | ORAL_CAPSULE | ORAL | Status: DC

## 2015-01-18 NOTE — Telephone Encounter (Signed)
Done.  Remind pt she will need an OV next month.  Also please let her know that Dr Patsy Lageropland will be leaving in Feb so she will need to find a different provider

## 2015-01-18 NOTE — Telephone Encounter (Signed)
Please advise on refill.

## 2015-01-18 NOTE — Telephone Encounter (Signed)
Called pt and advised message from provider on their voicemail.  

## 2015-01-21 ENCOUNTER — Telehealth: Payer: Self-pay

## 2015-01-21 NOTE — Telephone Encounter (Signed)
Notified pt Rxs are ready on VM.

## 2015-01-21 NOTE — Telephone Encounter (Signed)
Pt would like us to mail her amphetamine-dextroamphetamine (ADDERALL XR) 30 MG 24 hr capsule [[id number]][[id number]] and amphetamine-dextroamphetamine (ADDERALL) 10 MG tablet [[id number]][[id number]] to her at: [address] [place] [id number]. She said we had done this in the past. CB # [phone]

## 2015-01-22 ENCOUNTER — Encounter: Payer: Self-pay | Admitting: Family Medicine

## 2015-01-23 NOTE — Telephone Encounter (Signed)
Mailed rx today.

## 2015-01-30 ENCOUNTER — Encounter: Payer: Self-pay | Admitting: Family Medicine

## 2015-01-30 ENCOUNTER — Ambulatory Visit (INDEPENDENT_AMBULATORY_CARE_PROVIDER_SITE_OTHER): Payer: Commercial Managed Care - HMO | Admitting: Family Medicine

## 2015-01-30 VITALS — BP 140/86 | HR 92 | Temp 98.6°F | Resp 16 | Ht 63.0 in | Wt 111.6 lb

## 2015-01-30 DIAGNOSIS — F900 Attention-deficit hyperactivity disorder, predominantly inattentive type: Secondary | ICD-10-CM | POA: Diagnosis not present

## 2015-01-30 MED ORDER — AMPHETAMINE-DEXTROAMPHETAMINE 10 MG PO TABS: ORAL_TABLET | ORAL | Status: DC

## 2015-01-30 MED ORDER — AMPHETAMINE-DEXTROAMPHET ER 30 MG PO CP24: 30.0000 mg | ORAL_CAPSULE | ORAL | Status: DC

## 2015-01-30 NOTE — Patient Instructions (Addendum)
It was good to see you today- take care and please contact us in about 3 months for a refill  If you like, I would be happy to see you at my new office, [facility] starting next month Address: [address] [place], [place] [address] Phone: [phone]

## 2015-01-30 NOTE — Progress Notes (Signed)
[facility] [address] [place] [address] [phone]  Date:  01/30/2015   Name:  Danielle [name]   DOB:  April 05, 1980   MRN:  [id number]  PCP:  No primary care provider on file.    Chief Complaint: Medication Refill   History of Present Illness:  Danielle [name] is a 35 y.o. very pleasant female patient who presents with the following:  Here today for an adderall refill- last seen in the office in August.  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 This means she cannot eat red meat or pork anymore, and she does have an epipen on hand  She is now working 4 10 hour shifts weekly which is a harder schedule for her but she is doing ok  Patient Active Problem List   Diagnosis Date Noted  . Sinus tachycardia (HCC) 07/14/2012  . History of panic disorder 07/14/2012  . History of kidney stones 07/14/2012  . DDD (degenerative disc disease), lumbar 07/14/2012  . History of ovarian cyst 07/14/2012  . ADHD (attention deficit hyperactivity disorder) 07/12/2012    Past Medical History  Diagnosis Date  . Allergy   . Anxiety   . Kidney stones   . Gall stones     Past Surgical History  Procedure Laterality Date  . Wisdom tooth extraction      Social History  Substance Use Topics  . Smoking status: Former Smoker -- 0.50 packs/day    Types: Cigarettes  . Smokeless tobacco: Never Used  . Alcohol Use: Yes    Family History  Problem Relation Age of Onset  . Hypertension Mother   . Hyperlipidemia Mother   . Heart disease Mother   . Hyperlipidemia Father   . Hyperlipidemia Brother   . Hyperlipidemia Brother     Allergies  Allergen Reactions  . Amoxicillin Rash  . Penicillins Rash    Medication list has been reviewed and  updated.  Current Outpatient Prescriptions on File Prior to Visit  Medication Sig Dispense Refill  . ALPRAZolam (XANAX) 0.5 MG tablet take 1 tablet AT MIDDAY AND 1 TABLET AT BEDTIME 60 tablet 0  . amphetamine-dextroamphetamine (ADDERALL XR) 30 MG 24 hr capsule Take 1 capsule (30 mg total) by mouth every morning. 30 capsule 0  . amphetamine-dextroamphetamine (ADDERALL) 10 MG tablet Take 1 tablet in the early afternoon or as directed. 30 tablet 0  . metoprolol succinate (TOPROL-XL) 100 MG 24 hr tablet TAKE 1 TABLET (100 MG TOTAL) BY MOUTH DAILY. 90 tablet 0   No current facility-administered medications on file prior to visit.    Review of Systems:  As per HPI- otherwise negative.   Physical Examination: Filed Vitals:   01/30/15 1621 01/30/15 1626  BP: 130/90 140/86  Pulse: 92   Temp: 98.6 F (37 C)   Resp: 16    Filed Vitals:   01/30/15 1621  Height:  (1.6 m)  Weight: 111 lb 9.6 oz (50.621 kg)   Body mass index is 19.77 kg/(m^2). Ideal Body Weight: Weight in (lb) to have BMI = 25: 140.8  GEN: WDWN, NAD, Non-toxic, A & O x 3 HEENT: Atraumatic, Normocephalic. Neck supple. No masses, No LAD. Ears and Nose: No external  deformity. CV: RRR, No M/G/R. No JVD. No thrill. No extra heart sounds. PULM: CTA B, no wheezes, crackles, rhonchi. No retractions. No resp. distress. No accessory muscle use. ABD: S, NT, ND, +BS. No rebound. No HSM. EXTR: No c/c/e NEURO Normal gait.  PSYCH: Normally interactive. Conversant. Not depressed or anxious appearing.  Calm demeanor.    Assessment and Plan:

## 2015-02-19 ENCOUNTER — Other Ambulatory Visit: Payer: Self-pay

## 2015-02-19 MED ORDER — ALPRAZOLAM 0.5 MG PO TABS: ORAL_TABLET | ORAL | Status: DC

## 2015-02-19 NOTE — Telephone Encounter (Signed)
Pharm reqs RF of alprazolam. Pended. 

## 2015-04-11 ENCOUNTER — Encounter: Payer: Self-pay | Admitting: Family Medicine

## 2015-04-11 ENCOUNTER — Ambulatory Visit (INDEPENDENT_AMBULATORY_CARE_PROVIDER_SITE_OTHER): Payer: Commercial Managed Care - HMO | Admitting: Family Medicine

## 2015-04-11 VITALS — BP 120/80 | HR 70 | Temp 98.2°F | Ht 63.0 in | Wt 115.4 lb

## 2015-04-11 DIAGNOSIS — F411 Generalized anxiety disorder: Secondary | ICD-10-CM | POA: Diagnosis not present

## 2015-04-11 DIAGNOSIS — F900 Attention-deficit hyperactivity disorder, predominantly inattentive type: Secondary | ICD-10-CM | POA: Diagnosis not present

## 2015-04-11 DIAGNOSIS — Z91018 Allergy to other foods: Secondary | ICD-10-CM | POA: Diagnosis not present

## 2015-04-11 MED ORDER — AMPHETAMINE-DEXTROAMPHET ER 30 MG PO CP24: 30.0000 mg | ORAL_CAPSULE | ORAL | Status: DC

## 2015-04-11 MED ORDER — ALPRAZOLAM 0.5 MG PO TABS: ORAL_TABLET | ORAL | Status: DC

## 2015-04-11 MED ORDER — AMPHETAMINE-DEXTROAMPHETAMINE 10 MG PO TABS: ORAL_TABLET | ORAL | Status: DC

## 2015-04-11 NOTE — Progress Notes (Signed)
Pre visit review using our clinic review tool, if applicable. No additional management support is needed unless otherwise documented below in the visit note. 

## 2015-04-11 NOTE — Progress Notes (Signed)
[facility] at [facility] [address], [address] [place], [place] [address] [phone]  Date:  04/11/2015   Name:  Danielle [name]   DOB:  08-28-1980   MRN:  [id number]  PCP:  Abbe Amsterdam, MD    Chief Complaint: Medication Refill   History of Present Illness:  Danielle [name] is a 35 y.o. very pleasant female patient who presents with the following:  History of ADHD, panic disorder, tachycardia.  She has been on adderall xr 30 in the am and adderall 10 in the early afternoon as needed. This regimen is working well for her.  She also used xanax BID and metoprolol for her history of tachycardia  She is doing overall well.  She needs refills and is interested in allergy testing. 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  She would like to know if we can increase her xanax until her schedule is less hectic.  I declined to increase her benzo dose and instead encouraged her to simplify her schedule as she can and try to find time to relax   LMP 3/16 Patient Active Problem List   Diagnosis Date Noted  . Sinus tachycardia (HCC) 07/14/2012  . History of panic disorder 07/14/2012  . History of kidney stones 07/14/2012  . DDD (degenerative disc disease), lumbar 07/14/2012  . History of ovarian cyst 07/14/2012  . ADHD (attention deficit hyperactivity disorder) 07/12/2012    Past Medical History  Diagnosis Date  . Allergy   . Anxiety   . Kidney stones   . Gall stones     Past Surgical History  Procedure Laterality Date  . Wisdom tooth extraction      Social History  Substance Use Topics  . Smoking status: Former Smoker -- 0.50 packs/day    Types: Cigarettes  . Smokeless tobacco: Never Used  . Alcohol Use: Yes    Family History  Problem Relation  Age of Onset  . Hypertension Mother   . Hyperlipidemia Mother   . Heart disease Mother   . Hyperlipidemia Father   . Hyperlipidemia Brother   . Hyperlipidemia Brother     Allergies  Allergen Reactions  . Amoxicillin Rash  . Penicillins Rash    Medication list has been reviewed and updated.  Current Outpatient Prescriptions on File Prior to Visit  Medication Sig Dispense Refill  . ALPRAZolam (XANAX) 0.5 MG tablet take 1 tablet AT MIDDAY AND 1 TABLET AT BEDTIME 60 tablet 1  . amphetamine-dextroamphetamine (ADDERALL XR) 30 MG 24 hr capsule Take 1 capsule (30 mg total) by mouth every morning. Ok to fill 03/15/2015 30 capsule 0  . amphetamine-dextroamphetamine (ADDERALL) 10 MG tablet Take 1 tablet in the early afternoon or as directed. Ok to fill 3/32017 30 tablet 0  . metoprolol succinate (TOPROL-XL) 100 MG 24 hr tablet TAKE 1 TABLET (100 MG TOTAL) BY MOUTH DAILY. 90 tablet 0  . OVER THE COUNTER MEDICATION OTC Mammalian Protein Allergy taking     No current facility-administered medications on file prior to visit.    Review of Systems:  As per HPI- otherwise negative.   Physical Examination: Filed Vitals:   04/11/15 1752  BP: 120/80  Pulse: 70  Temp: 98.2 F (36.8 C)    GEN: WDWN,  NAD, Non-toxic, A & O x 3, slim build, looks well HEENT: Atraumatic, Normocephalic. Neck supple. No masses, No LAD. Ears and Nose: No external deformity. CV: RRR, No M/G/R. No JVD. No thrill. No extra heart sounds. PULM: CTA B, no wheezes, crackles, rhonchi. No retractions. No resp. distress. No accessory muscle use. EXTR: No c/c/e NEURO Normal gait.  PSYCH: Normally interactive. Conversant. Not depressed or anxious appearing.  Calm demeanor.    Assessment and Plan: Attention deficit hyperactivity disorder (ADHD), predominantly inattentive type - Plan: amphetamine-dextroamphetamine (ADDERALL XR) 30 MG 24 hr capsule, amphetamine-dextroamphetamine (ADDERALL) 10 MG tablet, DISCONTINUED:  amphetamine-dextroamphetamine (ADDERALL XR) 30 MG 24 hr capsule, DISCONTINUED: amphetamine-dextroamphetamine (ADDERALL) 10 MG tablet, DISCONTINUED: amphetamine-dextroamphetamine (ADDERALL XR) 30 MG 24 hr capsule, DISCONTINUED: amphetamine-dextroamphetamine (ADDERALL) 10 MG tablet  GAD (generalized anxiety disorder) - Plan: ALPRAZolam (XANAX) 0.5 MG tablet  Multiple food allergies - Plan: Ambulatory referral to Allergy  Referral to allergist for formal testing per her request Reviewed NCCSR and do not see any unexpected entries  Refilled her adderall for the next 3 months  Refilled xanax with 1 RF Counseled her that lifestyle management would be a better choice than increasing her benzodiazepine doseage  Signed Abbe AmsterdamJessica Jaiceon Collister, MD

## 2015-04-11 NOTE — Patient Instructions (Signed)
It was good to see you today I will get you set up for allergy testing Try to take some more time for yourself to relax!  Please contact me in 3 months for refills, recheck in 6 months.

## 2015-04-12 ENCOUNTER — Encounter: Payer: Self-pay | Admitting: Family Medicine

## 2015-04-24 ENCOUNTER — Other Ambulatory Visit: Payer: Self-pay | Admitting: Family Medicine

## 2015-06-19 ENCOUNTER — Other Ambulatory Visit: Payer: Self-pay | Admitting: Family Medicine

## 2015-07-18 ENCOUNTER — Telehealth: Payer: Self-pay | Admitting: Family Medicine

## 2015-07-18 DIAGNOSIS — R000 Tachycardia, unspecified: Secondary | ICD-10-CM

## 2015-07-18 DIAGNOSIS — F411 Generalized anxiety disorder: Secondary | ICD-10-CM

## 2015-07-18 DIAGNOSIS — F900 Attention-deficit hyperactivity disorder, predominantly inattentive type: Secondary | ICD-10-CM

## 2015-07-18 MED ORDER — AMPHETAMINE-DEXTROAMPHETAMINE 10 MG PO TABS: ORAL_TABLET | ORAL | Status: DC

## 2015-07-18 MED ORDER — ALPRAZOLAM 0.5 MG PO TABS: ORAL_TABLET | ORAL | Status: DC

## 2015-07-18 MED ORDER — AMPHETAMINE-DEXTROAMPHET ER 30 MG PO CP24: 30.0000 mg | ORAL_CAPSULE | ORAL | Status: DC

## 2015-07-18 MED ORDER — METOPROLOL SUCCINATE ER 100 MG PO TB24: 100.0000 mg | ORAL_TABLET | Freq: Every day | ORAL | Status: DC

## 2015-07-18 NOTE — Telephone Encounter (Signed)
Last OV in March: History of ADHD, panic disorder, tachycardia. She has been on adderall xr 30 in the am and adderall 10 in the early afternoon as needed. This regimen is working well for her. She also used xanax BID and metoprolol for her history of tachycardia  Did rx for her as requested.  Asked her to please fine allergist that she would like to see

## 2015-07-18 NOTE — Telephone Encounter (Signed)
Relation to BJ:YNWGpt:self Call back number:[id number] Pharmacy: [id number] [facility] [place] - [id number]  Reason for call:  Patient requesting a 3 month supply of the following medication:   amphetamine-dextroamphetamine (ADDERALL) 10 MG tablet  amphetamine-dextroamphetamine (ADDERALL XR) 30 MG 24 hr capsule  ALPRAZolam (XANAX) 0.5 MG tablet  metoprolol succinate (TOPROL-XL) 100 MG 24 hr tablet   [place]. Please [place] patient would like to follow up with insurance to verify it specialist is in network.

## 2015-10-15 ENCOUNTER — Other Ambulatory Visit: Payer: Self-pay | Admitting: Emergency Medicine

## 2015-10-15 ENCOUNTER — Telehealth: Payer: Self-pay | Admitting: Family Medicine

## 2015-10-15 DIAGNOSIS — F411 Generalized anxiety disorder: Secondary | ICD-10-CM

## 2015-10-15 DIAGNOSIS — R000 Tachycardia, unspecified: Secondary | ICD-10-CM

## 2015-10-15 DIAGNOSIS — F900 Attention-deficit hyperactivity disorder, predominantly inattentive type: Secondary | ICD-10-CM

## 2015-10-15 NOTE — Telephone Encounter (Signed)
.  Caller name: Relationship to patient: Self Can be reached: [phone]  Ph[facility]acy:  [facility] [address] [facility] [facility] [place] - [id number] [phone] (Phone) [phone])     Reason for call: refill amphetamine-dextroamphetamine (ADDERALL XR) 30 MG 24 hr capsule [[id number]][[id number]]  ALPRAZolam (XANAX) 0.5 MG tablet [[id number]][[id number]]  amphetamine-dextroamphetamine (ADDERALL) 10 MG tablet [[id number]][[id number]]   metoprolol succinate (TOPROL-XL) 100 MG 24 hr tablet [[id number]][[id number]]   Request 3 month supply.

## 2015-10-15 NOTE — Telephone Encounter (Signed)
Received refill request for:   amphetamine-dextroamphetamine (ADDERALL XR) 30 MG 24 hr capsule   ALPRAZolam (XANAX) 0.5 MG tablet   amphetamine-dextroamphetamine (ADDERALL) 10 MG tablet   metoprolol succinate (TOPROL-XL) 100 MG 24 hr tablet   Request 3 month supply.   Pt last office visit was 04/11/15 and last refills on 07/18/15. Will it be ok to refill? Please advise.

## 2015-10-15 NOTE — Telephone Encounter (Signed)
Called her and LMOM.  It is time for an OV.  Will leave 2 months of adderall for her tomorrow.  Otherwise please come and see me prior to further refills

## 2015-10-16 MED ORDER — AMPHETAMINE-DEXTROAMPHET ER 30 MG PO CP24: 30.0000 mg | ORAL_CAPSULE | ORAL | 0 refills | Status: DC

## 2015-10-16 MED ORDER — AMPHETAMINE-DEXTROAMPHETAMINE 10 MG PO TABS: ORAL_TABLET | ORAL | 0 refills | Status: DC

## 2015-10-16 MED ORDER — METOPROLOL SUCCINATE ER 100 MG PO TB24: 100.0000 mg | ORAL_TABLET | Freq: Every day | ORAL | 2 refills | Status: DC

## 2015-10-16 MED ORDER — ALPRAZOLAM 0.5 MG PO TABS: ORAL_TABLET | ORAL | 2 refills | Status: DC

## 2015-10-21 ENCOUNTER — Ambulatory Visit (INDEPENDENT_AMBULATORY_CARE_PROVIDER_SITE_OTHER): Payer: Commercial Managed Care - HMO | Admitting: Family Medicine

## 2015-10-21 ENCOUNTER — Encounter: Payer: Self-pay | Admitting: Family Medicine

## 2015-10-21 VITALS — BP 130/84 | HR 97 | Temp 98.2°F | Ht 63.0 in | Wt 115.8 lb

## 2015-10-21 DIAGNOSIS — R000 Tachycardia, unspecified: Secondary | ICD-10-CM | POA: Diagnosis not present

## 2015-10-21 DIAGNOSIS — F900 Attention-deficit hyperactivity disorder, predominantly inattentive type: Secondary | ICD-10-CM | POA: Diagnosis not present

## 2015-10-21 DIAGNOSIS — Z91018 Allergy to other foods: Secondary | ICD-10-CM | POA: Diagnosis not present

## 2015-10-21 DIAGNOSIS — F411 Generalized anxiety disorder: Secondary | ICD-10-CM | POA: Diagnosis not present

## 2015-10-21 MED ORDER — AMPHETAMINE-DEXTROAMPHET ER 30 MG PO CP24: 30.0000 mg | ORAL_CAPSULE | ORAL | 0 refills | Status: DC

## 2015-10-21 MED ORDER — AMPHETAMINE-DEXTROAMPHETAMINE 10 MG PO TABS: ORAL_TABLET | ORAL | 0 refills | Status: DC

## 2015-10-21 MED ORDER — EPINEPHRINE 0.3 MG/0.3ML IJ SOAJ: 0.3000 mg | Freq: Once | INTRAMUSCULAR | 3 refills | Status: AC

## 2015-10-21 NOTE — Patient Instructions (Addendum)
It was good to see you today!   Please have a urine drug screen done at the lab on your way out.

## 2015-10-21 NOTE — Progress Notes (Signed)
Pre visit review using our clinic review tool, if applicable. No additional management support is needed unless otherwise documented below in the visit note. 

## 2015-10-21 NOTE — Progress Notes (Addendum)
[facility] at [facility] [address], [address] [place], [place] [address] [phone]  Date:  10/21/2015   Name:  Danielle [name]   DOB:  03-22-1980   MRN:  [id number]  PCP:  Abbe Amsterdam, MD    Chief Complaint: Medication Follow Up (Pt here for med f/u on Adderall, Xanax. Pt is tolerating well. Will need refill on Epi Pen.)   History of Present Illness:  Danielle [name] is a 35 y.o. very pleasant female patient who presents with the following:  Here today for medication refill- last seen in March of this year.  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   She had a good summer, went to the beach a few times.    She has a pork/ beef protein allergy and needs an epipen   adhd sx are about the same- she is tolerating her meds well, eating and sleeping well She is still taking metoprolol for palpitations and xanax BID  Flu shot done at work already this year  She filled her adderall 10 mg on 10/4, xanax on 10/4, and the adderall xr on 9/13.  She has a 30 mg rx to fill 10/4 and 11/4  She has 2 refills on her xanax curently  LMP 10/4  Patient Active Problem List   Diagnosis Date Noted  . Sinus tachycardia 07/14/2012  . History of panic disorder 07/14/2012  . History of kidney stones 07/14/2012  . DDD (degenerative disc disease), lumbar 07/14/2012  . History of ovarian cyst 07/14/2012  . ADHD (attention deficit hyperactivity disorder) 07/12/2012    Past Medical History:  Diagnosis Date  . Allergy   . Anxiety   . Gall stones   . Kidney stones     Past Surgical History:  Procedure Laterality Date  . WISDOM TOOTH EXTRACTION      Social History  Substance Use Topics  . Smoking status: Former Smoker    Packs/day: 0.50    Types: Cigarettes  . Smokeless tobacco: Never Used  . Alcohol use Yes    Family History  Problem Relation Age of Onset  .  Hypertension Mother   . Hyperlipidemia Mother   . Heart disease Mother   . Hyperlipidemia Father   . Hyperlipidemia Brother   . Hyperlipidemia Brother     Allergies  Allergen Reactions  . Amoxicillin Rash  . Penicillins Rash    Medication list has been reviewed and updated.  Current Outpatient Prescriptions on File Prior to Visit  Medication Sig Dispense Refill  . ALPRAZolam (XANAX) 0.5 MG tablet take 1 tablet by mouth AT MIDDAY and 1 tablet at bedtime 60 tablet 2  . amphetamine-dextroamphetamine (ADDERALL XR) 30 MG 24 hr capsule Take 1 capsule (30 mg total) by mouth every morning. Ok to fill 60 days after rx 30 capsule 0  . amphetamine-dextroamphetamine (ADDERALL XR) 30 MG 24 hr capsule Take 1 capsule (30 mg total) by mouth every morning. Ok to fill 30 days after rx 30 capsule 0  . amphetamine-dextroamphetamine (ADDERALL XR) 30 MG 24 hr capsule Take 1 capsule (30 mg total) by mouth every morning. 30 capsule 0  . amphetamine-dextroamphetamine (ADDERALL) 10 MG tablet Take 1 tablet in the early afternoon as needed 30 tablet 0  . amphetamine-dextroamphetamine (ADDERALL) 10 MG tablet Take one in the early afternoon as needed. Ok to fill 30 days after rx 30  tablet 0  . amphetamine-dextroamphetamine (ADDERALL) 10 MG tablet Take 1 in the early afternoon as needed. 30 tablet 0  . metoprolol succinate (TOPROL-XL) 100 MG 24 hr tablet Take 1 tablet (100 mg total) by mouth daily. Take with or immediately following a meal. 90 tablet 2  . OVER THE COUNTER MEDICATION OTC Mammalian Protein Allergy taking     No current facility-administered medications on file prior to visit.     Review of Systems:  As per HPI- otherwise negative. No fever, chills, nausea, vomiting or abd pain  Physical Examination: Vitals:   10/21/15 1634 10/21/15 1641  BP: (!) 143/81 130/84  Pulse: 97   Temp: 98.2 F (36.8 C)    Vitals:   10/21/15 1634  Weight: 115 lb 12.8 oz (52.5 kg)  Height: 5\' 3"  (1.6 m)   Body  mass index is 20.51 kg/m. Ideal Body Weight: Weight in (lb) to have BMI = 25: 140.8  GEN: WDWN, NAD, Non-toxic, A & O x 3, slim build, looks well HEENT: Atraumatic, Normocephalic. Neck supple. No masses, No LAD. Ears and Nose: No external deformity. CV: RRR, No M/G/R. No JVD. No thrill. No extra heart sounds. PULM: CTA B, no wheezes, crackles, rhonchi. No retractions. No resp. distress. No accessory muscle use. EXTR: No c/c/e NEURO Normal gait.  PSYCH: Normally interactive. Conversant. Not depressed or anxious appearing.  Calm demeanor.    Assessment and Plan: Tachycardia  Attention deficit hyperactivity disorder (ADHD), predominantly inattentive type - Plan: amphetamine-dextroamphetamine (ADDERALL XR) 30 MG 24 hr capsule, amphetamine-dextroamphetamine (ADDERALL) 10 MG tablet  GAD (generalized anxiety disorder)  Multiple food allergies   Refilled her adderall today and also her epi-pen today UDS today at lab--> lab closed, will have to do another day Plan to follow-up in 6 months, 3 months for refills via mychart or phone  Signed Abbe AmsterdamJessica Copland, MD

## 2015-11-19 ENCOUNTER — Telehealth: Payer: Self-pay | Admitting: Family Medicine

## 2015-11-19 NOTE — Telephone Encounter (Signed)
Caller name: Relationship to patient: Self Can be reached: [phone]  Pharmacy:  Reason for call: Please call patient to explain results of Toxicology Urine Screen

## 2015-11-20 NOTE — Telephone Encounter (Signed)
Tried to contact pt to explain results of toxicology urine screen. No answer, left message for pt to return call.

## 2015-11-20 NOTE — Telephone Encounter (Signed)
Spoke with pt answered questions regarding toxicology urine screen results. Pt has no additional concerns at this time.

## 2016-01-21 ENCOUNTER — Encounter: Payer: Self-pay | Admitting: Family Medicine

## 2016-01-21 ENCOUNTER — Telehealth: Payer: Self-pay | Admitting: Family Medicine

## 2016-01-21 DIAGNOSIS — F411 Generalized anxiety disorder: Secondary | ICD-10-CM

## 2016-01-21 DIAGNOSIS — F900 Attention-deficit hyperactivity disorder, predominantly inattentive type: Secondary | ICD-10-CM

## 2016-01-21 NOTE — Telephone Encounter (Signed)
Self. Pt called in to request a refill for 3 medications ADDERALL 10MG  and Adderall 6MG  ALPRAZolam  pt would like a 6 months supply if possible ? Pt says that she would like to pick up today if possible    [phone]

## 2016-01-22 MED ORDER — AMPHETAMINE-DEXTROAMPHET ER 30 MG PO CP24: 30.0000 mg | ORAL_CAPSULE | ORAL | 0 refills | Status: DC

## 2016-01-22 MED ORDER — AMPHETAMINE-DEXTROAMPHETAMINE 10 MG PO TABS: ORAL_TABLET | ORAL | 0 refills | Status: DC

## 2016-01-22 MED ORDER — ALPRAZOLAM 0.5 MG PO TABS: ORAL_TABLET | ORAL | 2 refills | Status: DC

## 2016-01-22 NOTE — Telephone Encounter (Signed)
Reviewed NCCXR Last fill xanax #60 01/13/16 adderal IR 10 #30 on 1/1 adderall XR #30 on 12/24/15 She did get 3 rx of Rx for hydrocodone -39 total pills- from DDS/DMD over October/ November  She is due for xanax- can have a one month supply with 2 rf Will also give 3 rx for adderall xr 30 mg and adderall IR 10 mg  She needs to do contract- will place with rx UDS is UTD

## 2016-04-17 ENCOUNTER — Encounter: Payer: Self-pay | Admitting: Family Medicine

## 2016-04-17 ENCOUNTER — Other Ambulatory Visit: Payer: Self-pay | Admitting: Emergency Medicine

## 2016-04-17 ENCOUNTER — Telehealth: Payer: Self-pay | Admitting: Family Medicine

## 2016-04-17 DIAGNOSIS — F900 Attention-deficit hyperactivity disorder, predominantly inattentive type: Secondary | ICD-10-CM

## 2016-04-17 DIAGNOSIS — R000 Tachycardia, unspecified: Secondary | ICD-10-CM

## 2016-04-17 MED ORDER — METOPROLOL SUCCINATE ER 100 MG PO TB24: 100.0000 mg | ORAL_TABLET | Freq: Every day | ORAL | 2 refills | Status: DC

## 2016-04-17 NOTE — Telephone Encounter (Signed)
Caller name: Relationship to patient: Self Can be reached: [phone]  Pharmacy:  Reason for call: Refill metoprolol succinate (TOPROL-XL) 100 MG 24 hr tablet [[id number]  ALPRAZolam (XANAX) 0.5 MG tablet  amphetamine-dextroamphetamine (ADDERALL XR) 30 MG 24 hr capsule amphetamine-dextroamphetamine (ADDERALL) 10 MG tablet [[id number]

## 2016-04-17 NOTE — Telephone Encounter (Signed)
Refill sent for metoprolol succinate. Refills for controls sent to provider for approval.

## 2016-04-20 MED ORDER — AMPHETAMINE-DEXTROAMPHET ER 30 MG PO CP24: 30.0000 mg | ORAL_CAPSULE | ORAL | 0 refills | Status: DC

## 2016-04-20 MED ORDER — AMPHETAMINE-DEXTROAMPHETAMINE 10 MG PO TABS: ORAL_TABLET | ORAL | 0 refills | Status: DC

## 2016-04-23 ENCOUNTER — Ambulatory Visit: Payer: Commercial Managed Care - HMO | Admitting: Family Medicine

## 2016-04-23 NOTE — Progress Notes (Deleted)
[facility] at [facility] [address], [address] [place], [place] [address] [phone]  Date:  04/23/2016   Name:  Danielle [name]   DOB:  05-04-1980   MRN:  [id number]  PCP:  Abbe Amsterdam, MD    Chief Complaint: No chief complaint on file.   History of Present Illness:  Danielle [name] is a 36 y.o. very pleasant female patient who presents with the following:  Here today for a follow-up visit She is on adderall for ADHD- she takes 30 xr daily and 10 IR prn   NCCSR: she last filed her IR on 3/30, and her ER early March No unexpected entries noted She is also on alprazolam prn She is due for a UDS today, and needs a contract signed  Patient Active Problem List   Diagnosis Date Noted  . Sinus tachycardia 07/14/2012  . History of panic disorder 07/14/2012  . History of kidney stones 07/14/2012  . DDD (degenerative disc disease), lumbar 07/14/2012  . History of ovarian cyst 07/14/2012  . ADHD (attention deficit hyperactivity disorder) 07/12/2012    Past Medical History:  Diagnosis Date  . Allergy   . Anxiety   . Gall stones   . Kidney stones     Past Surgical History:  Procedure Laterality Date  . WISDOM TOOTH EXTRACTION      Social History  Substance Use Topics  . Smoking status: Former Smoker    Packs/day: 0.50    Types: Cigarettes  . Smokeless tobacco: Never Used  . Alcohol use Yes    Family History  Problem Relation Age of Onset  . Hypertension Mother   . Hyperlipidemia Mother   . Heart disease Mother   . Hyperlipidemia Father   . Hyperlipidemia Brother   . Hyperlipidemia Brother     Allergies  Allergen Reactions  . Amoxicillin Rash  . Penicillins Rash    Medication list has been reviewed and updated.  Current Outpatient Prescriptions on File Prior to Visit  Medication Sig Dispense Refill  . ALPRAZolam (XANAX) 0.5 MG tablet take 1 tablet by mouth AT MIDDAY and 1 tablet at bedtime 60 tablet 2  .  amphetamine-dextroamphetamine (ADDERALL XR) 30 MG 24 hr capsule Take 1 capsule (30 mg total) by mouth every morning. Ok to fill 03/21/16 30 capsule 0  . amphetamine-dextroamphetamine (ADDERALL XR) 30 MG 24 hr capsule Take 1 capsule (30 mg total) by mouth every morning. Ok to fill 02/22/16 30 capsule 0  . amphetamine-dextroamphetamine (ADDERALL XR) 30 MG 24 hr capsule Take 1 capsule (30 mg total) by mouth every morning. Ok to fill 01/22/16 30 capsule 0  . amphetamine-dextroamphetamine (ADDERALL) 10 MG tablet Take 1 in the early afternoon as needed. Ok to fill 03/11/16 30 tablet 0  . amphetamine-dextroamphetamine (ADDERALL) 10 MG tablet Take 1 tablet in the early afternoon as needed 30 tablet 0  . amphetamine-dextroamphetamine (ADDERALL) 10 MG tablet Take one in the early afternoon as needed. Ok to fill 02/11/16 30 tablet 0  . metoprolol succinate (TOPROL-XL) 100 MG 24 hr tablet Take 1 tablet (100 mg total) by mouth daily. Take with or immediately following a meal. 90 tablet 2  . OVER THE COUNTER MEDICATION OTC Mammalian Protein Allergy taking     No current facility-administered medications on file prior to visit.     Review of Systems:  As per HPI- otherwise negative.   Physical Examination: There were no vitals filed for this visit. There were  no vitals filed for this visit. There is no height or weight on file to calculate BMI. Ideal Body Weight:    GEN: WDWN, NAD, Non-toxic, A & O x 3 HEENT: Atraumatic, Normocephalic. Neck supple. No masses, No LAD. Ears and Nose: No external deformity. CV: RRR, No M/G/R. No JVD. No thrill. No extra heart sounds. PULM: CTA B, no wheezes, crackles, rhonchi. No retractions. No resp. distress. No accessory muscle use. ABD: S, NT, ND, +BS. No rebound. No HSM. EXTR: No c/c/e NEURO Normal gait.  PSYCH: Normally interactive. Conversant. Not depressed or anxious appearing.  Calm demeanor.    Assessment and Plan: ***  Signed Abbe Amsterdam, MD

## 2016-04-24 ENCOUNTER — Telehealth: Payer: Self-pay | Admitting: Family Medicine

## 2016-04-24 NOTE — Telephone Encounter (Signed)
Relation to ZO:XWRU Call back [phone] Pharmacy:    [id number] - [place], [place] - [address] AT [facility] [phone] (Phone) [phone] (Fax)     Reason for call:  Patient requesting a refill amphetamine-dextroamphetamine (ADDERALL XR) 30 MG 24 hr capsule , amphetamine-dextroamphetamine (ADDERALL) 10 MG tablet , ALPRAZolam (XANAX) 0.5 MG tablet , metoprolol succinate (TOPROL-XL) 100 MG 24 hr tablet, please advise

## 2016-04-27 ENCOUNTER — Other Ambulatory Visit: Payer: Self-pay | Admitting: Emergency Medicine

## 2016-04-27 DIAGNOSIS — F900 Attention-deficit hyperactivity disorder, predominantly inattentive type: Secondary | ICD-10-CM

## 2016-04-27 DIAGNOSIS — F411 Generalized anxiety disorder: Secondary | ICD-10-CM

## 2016-04-27 MED FILL — ADDERALL XR 30 MG CAP SA: 30 | 30 days supply | Qty: 30 | Fill #0

## 2016-04-27 NOTE — Telephone Encounter (Signed)
Requesting: ALPRAZolam Prudy Feeler) 0.5MG , amphetamine-dextroamphetamine (ADDERALL XR)  AND  Contract: 10/21/15 no control substance contract signed, uds sample given, low risk next screen 04/20/16. Last OV:  10/21/2015 Last Refill : 01/22/16  Please Advise

## 2016-04-27 NOTE — Telephone Encounter (Signed)
adderall that I refilled 2 weeks ago still sitting in pick up drawer.  Pt must not have seen my mychart message. Called her and [facility]- refills are ready for pick up Needs an OV

## 2016-04-27 NOTE — Telephone Encounter (Signed)
Called and LMOM- I refilled her adderall, it is at the front,  I think her other meds are all called in for her but let me know if any concerns.  Please see me - it has ben 6 months

## 2016-04-30 ENCOUNTER — Ambulatory Visit: Payer: Commercial Managed Care - HMO | Admitting: Family Medicine

## 2016-05-07 ENCOUNTER — Encounter: Payer: Self-pay | Admitting: Family Medicine

## 2016-05-07 ENCOUNTER — Ambulatory Visit (INDEPENDENT_AMBULATORY_CARE_PROVIDER_SITE_OTHER): Payer: Commercial Managed Care - HMO | Admitting: Family Medicine

## 2016-05-07 VITALS — BP 110/70 | HR 78 | Temp 98.4°F | Ht 63.0 in | Wt 109.4 lb

## 2016-05-07 DIAGNOSIS — Z130 Encounter for screening for diseases of the blood and blood-forming organs and certain disorders involving the immune mechanism: Secondary | ICD-10-CM

## 2016-05-07 DIAGNOSIS — M5441 Lumbago with sciatica, right side: Secondary | ICD-10-CM | POA: Diagnosis not present

## 2016-05-07 DIAGNOSIS — F411 Generalized anxiety disorder: Secondary | ICD-10-CM | POA: Diagnosis not present

## 2016-05-07 DIAGNOSIS — Z1329 Encounter for screening for other suspected endocrine disorder: Secondary | ICD-10-CM

## 2016-05-07 DIAGNOSIS — Z13220 Encounter for screening for lipoid disorders: Secondary | ICD-10-CM

## 2016-05-07 DIAGNOSIS — F900 Attention-deficit hyperactivity disorder, predominantly inattentive type: Secondary | ICD-10-CM | POA: Diagnosis not present

## 2016-05-07 DIAGNOSIS — Z87892 Personal history of anaphylaxis: Secondary | ICD-10-CM | POA: Diagnosis not present

## 2016-05-07 DIAGNOSIS — Z131 Encounter for screening for diabetes mellitus: Secondary | ICD-10-CM | POA: Diagnosis not present

## 2016-05-07 MED ORDER — AMPHETAMINE-DEXTROAMPHET ER 30 MG PO CP24: 30.0000 mg | ORAL_CAPSULE | ORAL | 0 refills | Status: DC

## 2016-05-07 MED ORDER — AMPHETAMINE-DEXTROAMPHETAMINE 10 MG PO TABS: ORAL_TABLET | ORAL | 0 refills | Status: DC

## 2016-05-07 MED ORDER — TRAMADOL HCL 50 MG PO TABS: 50.0000 mg | ORAL_TABLET | Freq: Three times a day (TID) | ORAL | 0 refills | Status: DC | PRN

## 2016-05-07 MED ORDER — PREDNISONE 20 MG PO TABS: ORAL_TABLET | ORAL | 0 refills | Status: DC

## 2016-05-07 MED ORDER — ALPRAZOLAM 0.5 MG PO TABS: ORAL_TABLET | ORAL | 2 refills | Status: DC

## 2016-05-07 MED ORDER — MELOXICAM 7.5 MG PO TABS: 7.5000 mg | ORAL_TABLET | Freq: Every day | ORAL | 6 refills | Status: DC

## 2016-05-07 MED ORDER — EPINEPHRINE 0.3 MG/0.3ML IJ SOAJ: 0.3000 mg | Freq: Once | INTRAMUSCULAR | 1 refills | Status: DC

## 2016-05-07 NOTE — Progress Notes (Addendum)
[facility] [address], [address] [place], [place] [address] [phone]  Date:  05/07/2016   Name:  Danielle [name]   DOB:  06/24/1980   MRN:  [id number]  PCP:  Abbe Amsterdam, MD    Chief Complaint: Follow-up (Pt here for f/u visit. Also c/o lower back pain that radiates down leg all the way to foot and is numb and painful x 2 weeks. Would like labs done today )   History of Present Illness:  Danielle [name] is a 36 y.o. very pleasant female patient who presents with the following:  Here today for follow-up on her ADD and anxiety.  She is treated with xanax and adderall - she uses a combination of XR and IR adderall. She takes the  XR in the morning and  of IR in the early afternoon if needed She also uses xanax BID for anxiety and BB for palpitations  NCCSR: reviewed,  Nothing unexpected noted   She has 2 jobs right now- her main job is very physically active and seems to have flared up an old back injury.  A couple of weeks ago she developed some lower back pain which is radiating down her RIGHT leg She also has some tingling down her leg and into her foot She had this issue approx 10 years ago on the LEFT- she had an MRI, but did not need any surgery. Her sx got better until recently  She has no known injury.  Somewhat insidious onset She has pain really all the time- no better or worse in any particular position No bowel or bladder control issues.  The leg does feel numb and is somewhat weak.    Even before her acute back injury she often feels very achy and tired after work- wonders what she can take for her sx  She is not fasting right now  UDS given last fall Needs a contract - will do today  She also asked my assistant for a refill of her epipen and an inhaler- ?which- after I had left the room.  Refilled epipen and sent her a message asking about what inhaler she needs   LMP 4/16  Patient Active Problem  List   Diagnosis Date Noted  . Sinus tachycardia 07/14/2012  . History of panic disorder 07/14/2012  . History of kidney stones 07/14/2012  . DDD (degenerative disc disease), lumbar 07/14/2012  . History of ovarian cyst 07/14/2012  . ADHD (attention deficit hyperactivity disorder) 07/12/2012    Past Medical History:  Diagnosis Date  . Allergy   . Anxiety   . Gall stones   . Kidney stones     Past Surgical History:  Procedure Laterality Date  . WISDOM TOOTH EXTRACTION      Social History  Substance Use Topics  . Smoking status: Former Smoker    Packs/day: 0.50    Types: Cigarettes  . Smokeless tobacco: Never Used  . Alcohol use Yes    Family History  Problem Relation Age of Onset  . Hypertension Mother   . Hyperlipidemia Mother   . Heart disease Mother   . Hyperlipidemia Father   . Hyperlipidemia Brother   . Hyperlipidemia Brother     Allergies  Allergen Reactions  . Amoxicillin Rash  . Penicillins Rash    Medication list has been reviewed and updated.  Current Outpatient Prescriptions on File Prior to Visit  Medication Sig Dispense Refill  . metoprolol  succinate (TOPROL-XL) 100 MG 24 hr tablet Take 1 tablet (100 mg total) by mouth daily. Take with or immediately following a meal. 90 tablet 2  . OVER THE COUNTER MEDICATION OTC Mammalian Protein Allergy taking     No current facility-administered medications on file prior to visit.     Review of Systems:  As per HPI- otherwise negative. Wt Readings from Last 3 Encounters:  05/07/16 109 lb 6.4 oz (49.6 kg)  10/21/15 115 lb 12.8 oz (52.5 kg)  04/11/15 115 lb 6.4 oz (52.3 kg)      Physical Examination: Vitals:   05/07/16 1656  BP: 110/70  Pulse: 78  Temp: 98.4 F (36.9 C)   Vitals:   05/07/16 1656  Weight: 109 lb 6.4 oz (49.6 kg)  Height:  (1.6 m)   Body mass index is 19.38 kg/m. Ideal Body Weight: Weight in (lb) to have BMI = 25: 140.8  GEN: WDWN, NAD, Non-toxic, A & O x 3,  slender build, looks well HEENT: Atraumatic, Normocephalic. Neck supple. No masses, No LAD.  Bilateral TM wnl, oropharynx normal.  PEERL,EOMI.   Ears and Nose: No external deformity. CV: RRR, No M/G/R. No JVD. No thrill. No extra heart sounds. PULM: CTA B, no wheezes, crackles, rhonchi. No retractions. No resp. distress. No accessory muscle use. EXTR: No c/c/e NEURO Normal gait.  PSYCH: Normally interactive. Conversant. Not depressed or anxious appearing.  Calm demeanor.  Normal flexion and extension of her back Positive SLR on the right only Normal BLE strength, sensation and DTR.   No saddle anesthesia Able to do a toe raise   Assessment and Plan: Attention deficit hyperactivity disorder (ADHD), predominantly inattentive type - Plan: amphetamine-dextroamphetamine (ADDERALL) 10 MG tablet, amphetamine-dextroamphetamine (ADDERALL) 10 MG tablet, amphetamine-dextroamphetamine (ADDERALL) 10 MG tablet, amphetamine-dextroamphetamine (ADDERALL XR) 30 MG 24 hr capsule, amphetamine-dextroamphetamine (ADDERALL XR) 30 MG 24 hr capsule, amphetamine-dextroamphetamine (ADDERALL XR) 30 MG 24 hr capsule  GAD (generalized anxiety disorder) - Plan: ALPRAZolam (XANAX) 0.5 MG tablet  Acute right-sided low back pain with right-sided sciatica - Plan: traMADol (ULTRAM) 50 MG tablet, meloxicam (MOBIC) 7.5 MG tablet, predniSONE (DELTASONE) 20 MG tablet, DISCONTINUED: predniSONE (DELTASONE) 20 MG tablet, DISCONTINUED: meloxicam (MOBIC) 7.5 MG tablet  Screening for thyroid disorder - Plan: TSH  Screening for hyperlipidemia - Plan: Lipid panel  Screening for deficiency anemia - Plan: CBC  Screening for diabetes mellitus - Plan: Comprehensive metabolic panel  History of anaphylaxis - Plan: EPINEPHrine (EPIPEN 2-PAK) 0.3 mg/0.3 mL IJ SOAJ injection  Here today seeking refills of her chronic adderall- did rx for now and for 30 days, 60 days Also refilled her xanax, 1 month with 2 RF Basic screening labs as  above Pt has complaint of acute back pain with radiation suggestive of nerve involvement rx for a prednisone taper and a small supply of tramadol to use as needed for pain- after finished with prednisone can change over to mobic  Meds ordered this encounter  Medications  . DISCONTD: predniSONE (DELTASONE) 20 MG tablet    Sig: Take 2 pills a day for 4 days, then 1 pill a day for 4 days    Dispense:  12 tablet    Refill:  0  . traMADol (ULTRAM) 50 MG tablet    Sig: Take 1 tablet (50 mg total) by mouth every 8 (eight) hours as needed. For back pain    Dispense:  20 tablet    Refill:  0  . amphetamine-dextroamphetamine (ADDERALL) 10 MG tablet  Sig: Take 1 in the early afternoon as needed.    Dispense:  30 tablet    Refill:  0  . amphetamine-dextroamphetamine (ADDERALL) 10 MG tablet    Sig: Take 1 tablet in the early afternoon as needed    Dispense:  30 tablet    Refill:  0    Ok to fill 30 days after rx  . amphetamine-dextroamphetamine (ADDERALL) 10 MG tablet    Sig: Take one in the early afternoon as needed. Ok to fill 60 days after rx    Dispense:  30 tablet    Refill:  0  . amphetamine-dextroamphetamine (ADDERALL XR) 30 MG 24 hr capsule    Sig: Take 1 capsule (30 mg total) by mouth every morning. Ok to fill 30 days after rx    Dispense:  30 capsule    Refill:  0  . amphetamine-dextroamphetamine (ADDERALL XR) 30 MG 24 hr capsule    Sig: Take 1 capsule (30 mg total) by mouth every morning. Ok to fill 60 days after rx    Dispense:  30 capsule    Refill:  0  . amphetamine-dextroamphetamine (ADDERALL XR) 30 MG 24 hr capsule    Sig: Take 1 capsule (30 mg total) by mouth every morning.    Dispense:  30 capsule    Refill:  0  . ALPRAZolam (XANAX) 0.5 MG tablet    Sig: take 1 tablet by mouth AT MIDDAY and 1 tablet at bedtime    Dispense:  60 tablet    Refill:  2  . DISCONTD: meloxicam (MOBIC) 7.5 MG tablet    Sig: Take 1 tablet (7.5 mg total) by mouth daily. Use as needed for pain     Dispense:  30 tablet    Refill:  6  . meloxicam (MOBIC) 7.5 MG tablet    Sig: Take 1 tablet (7.5 mg total) by mouth daily. Use as needed for pain    Dispense:  30 tablet    Refill:  6  . predniSONE (DELTASONE) 20 MG tablet    Sig: Take 2 pills a day for 4 days, then 1 pill a day for 4 days    Dispense:  12 tablet    Refill:  0  . EPINEPHrine (EPIPEN 2-PAK) 0.3 mg/0.3 mL IJ SOAJ injection    Sig: Inject 0.3 mLs (0.3 mg total) into the muscle once.    Dispense:  2 Device    Refill:  1     Signed Abbe Amsterdam, MD

## 2016-05-07 NOTE — Patient Instructions (Addendum)
It was nice to see you today!  Use the prednisone as directed for 8 days and the tramadol as needed for more severe pain.  Tylenol is also ok but no NSAIDs while on prednisone Once you finish the prednisone you can start on the mobic as needed for back pain  Remember that tramadol can make you drowsy, especially when used with xanax  Please let me know if your back does not improve in about 2 weeks In any case let's visit in 6 months

## 2016-05-08 ENCOUNTER — Encounter: Payer: Self-pay | Admitting: Family Medicine

## 2016-05-08 LAB — COMPREHENSIVE METABOLIC PANEL
ALT: 13 U/L (ref 0–35)
AST: 17 U/L (ref 0–37)
Albumin: 4.6 g/dL (ref 3.5–5.2)
Alkaline Phosphatase: 41 U/L (ref 39–117)
BUN: 17 mg/dL (ref 6–23)
CHLORIDE: 106 meq/L (ref 96–112)
CO2: 27 meq/L (ref 19–32)
CREATININE: 0.98 mg/dL (ref 0.40–1.20)
Calcium: 9.9 mg/dL (ref 8.4–10.5)
GFR: 69.01 mL/min (ref >60.00)
Glucose, Bld: 89 mg/dL (ref 70–99)
POTASSIUM: 4.3 meq/L (ref 3.5–5.1)
Sodium: 141 mEq/L (ref 135–145)
Total Bilirubin: 0.3 mg/dL (ref 0.2–1.2)
Total Protein: 7.1 g/dL (ref 6.0–8.3)

## 2016-05-08 LAB — CBC
HCT: 35.9 % — ABNORMAL LOW (ref 36.0–46.0)
Hemoglobin: 12.1 g/dL (ref 12.0–15.0)
MCHC: 33.7 g/dL (ref 30.0–36.0)
MCV: 96.9 fl (ref 78.0–100.0)
PLATELETS: 393 10*3/uL (ref 150.0–400.0)
RBC: 3.71 Mil/uL — ABNORMAL LOW (ref 3.87–5.11)
RDW: 12.6 % (ref 11.5–15.5)
WBC: 6.7 10*3/uL (ref 4.0–10.5)

## 2016-05-08 LAB — LIPID PANEL
CHOL/HDL RATIO: 2
CHOLESTEROL: 153 mg/dL (ref 0–200)
HDL: 65 mg/dL (ref >39.00)
LDL CALC: 73 mg/dL (ref 0–99)
NonHDL: 87.6
TRIGLYCERIDES: 71 mg/dL (ref 0.0–149.0)
VLDL: 14.2 mg/dL (ref 0.0–40.0)

## 2016-05-08 LAB — TSH: TSH: 0.52 u[IU]/mL (ref 0.35–4.50)

## 2016-07-20 ENCOUNTER — Other Ambulatory Visit: Payer: Self-pay | Admitting: Family Medicine

## 2016-07-20 DIAGNOSIS — R000 Tachycardia, unspecified: Secondary | ICD-10-CM

## 2016-07-22 ENCOUNTER — Other Ambulatory Visit: Payer: Self-pay | Admitting: Emergency Medicine

## 2016-07-22 DIAGNOSIS — R000 Tachycardia, unspecified: Secondary | ICD-10-CM

## 2016-07-22 MED ORDER — METOPROLOL SUCCINATE ER 100 MG PO TB24: 100.0000 mg | ORAL_TABLET | Freq: Every day | ORAL | 2 refills | Status: DC

## 2016-08-05 ENCOUNTER — Other Ambulatory Visit: Payer: Self-pay | Admitting: Family Medicine

## 2016-08-05 DIAGNOSIS — F900 Attention-deficit hyperactivity disorder, predominantly inattentive type: Secondary | ICD-10-CM

## 2016-08-05 DIAGNOSIS — F411 Generalized anxiety disorder: Secondary | ICD-10-CM

## 2016-08-05 NOTE — Telephone Encounter (Signed)
Self.   Refill request for : Adderall 10 MG                               Adderall 30 MG                   Alprazolam and Metoprolol   Pt is would like a 90 day supply.    Pt would like to be called when ready for pick up.

## 2016-08-06 ENCOUNTER — Other Ambulatory Visit: Payer: Self-pay | Admitting: Emergency Medicine

## 2016-08-06 DIAGNOSIS — F411 Generalized anxiety disorder: Secondary | ICD-10-CM

## 2016-08-07 ENCOUNTER — Other Ambulatory Visit: Payer: Self-pay | Admitting: Emergency Medicine

## 2016-08-07 DIAGNOSIS — R000 Tachycardia, unspecified: Secondary | ICD-10-CM

## 2016-08-07 DIAGNOSIS — F411 Generalized anxiety disorder: Secondary | ICD-10-CM

## 2016-08-07 DIAGNOSIS — F900 Attention-deficit hyperactivity disorder, predominantly inattentive type: Secondary | ICD-10-CM

## 2016-08-07 MED ORDER — ALPRAZOLAM 0.5 MG PO TABS: ORAL_TABLET | ORAL | 0 refills | Status: DC

## 2016-08-07 NOTE — Telephone Encounter (Signed)
Requesting: Contract: 05/07/16 controlled substance contract signed, no uds samlpe. Last OV: 05/07/16 Last Refill: 05/07/16  Please Advise

## 2016-08-07 NOTE — Telephone Encounter (Signed)
Requesting: Adderall 10 MG, Adderall 30 MG and Metoprolol  Contract: 05/07/16  UDS: No UDS sample Last OV: 05/07/16 Last Refill: 05/07/16  Pt is would like a 90 day supply.    Pt would like to be called when ready for pick up.

## 2016-08-07 NOTE — Telephone Encounter (Signed)
Reviewed this after hours. Seems reasonable to refill but controlled med and not in office to sign script. So will you pass it on to Doc of the day today.

## 2016-08-09 MED ORDER — METOPROLOL SUCCINATE ER 100 MG PO TB24: 100.0000 mg | ORAL_TABLET | Freq: Every day | ORAL | 2 refills | Status: DC

## 2016-08-09 MED ORDER — AMPHETAMINE-DEXTROAMPHETAMINE 10 MG PO TABS: ORAL_TABLET | ORAL | 0 refills | Status: DC

## 2016-08-09 MED ORDER — AMPHETAMINE-DEXTROAMPHET ER 30 MG PO CP24: 30.0000 mg | ORAL_CAPSULE | ORAL | 0 refills | Status: DC

## 2016-08-10 MED FILL — DEXTROAMP-AMP 10 MG TAB: 10 | 30 days supply | Qty: 30 | Fill #0

## 2016-08-10 NOTE — Telephone Encounter (Signed)
Called left detailed message adderall prescription is ready for pickup at the front desk.

## 2016-08-17 MED ORDER — AMPHETAMINE-DEXTROAMPHETAMINE 10 MG PO TABS: ORAL_TABLET | ORAL | 0 refills | Status: DC

## 2016-08-17 MED ORDER — AMPHETAMINE-DEXTROAMPHET ER 30 MG PO CP24: 30.0000 mg | ORAL_CAPSULE | ORAL | 0 refills | Status: DC

## 2016-08-17 NOTE — Telephone Encounter (Signed)
Pt called in she said that PCP usually provides her with a 3 month supply but due to PCP absence she only received a 30 days supply.     ALSO, pt said that she lost her Rx for  30 MG Adderall, she would like to know if provider could give her another one?    CB: [phone]

## 2016-08-17 NOTE — Telephone Encounter (Signed)
NCCSR- last filled her 10 mg on 7/30 per Dr. Abner GreenspanBlyth. She did not fill the 30 mg- reminded pt that she should not lose these rx!  Called pt to let her know rx is ready

## 2016-08-18 MED FILL — ADDERALL XR 30 MG CAP SA: 30 | 30 days supply | Qty: 30 | Fill #0

## 2016-09-06 ENCOUNTER — Other Ambulatory Visit: Payer: Self-pay | Admitting: Family Medicine

## 2016-09-06 DIAGNOSIS — F411 Generalized anxiety disorder: Secondary | ICD-10-CM

## 2016-09-07 NOTE — Telephone Encounter (Signed)
NCCSR: she filled her alprazolam about a month ago- a one month supply Will refill for her today

## 2016-10-16 ENCOUNTER — Telehealth: Payer: Self-pay | Admitting: Family Medicine

## 2016-10-16 NOTE — Telephone Encounter (Signed)
Caller name: Aubrey Relation to pt: self  Call back number: [phone] Pharmacy:  Reason for call: Pt is needing refill for 3 months supply on amphetamine-dextroamphetamine (ADDERALL) 10 MG tablet, amphetamine-dextroamphetamine (ADDERALL XR) 30 MG 24 hr capsule and ALPRAZolam (XANAX) 0.5 MG tablet. Pt has schedule a 6 month fu appt for this Thursday Oct 22, 2016 at 5:00. Please advise.

## 2016-10-17 NOTE — Telephone Encounter (Signed)
Called pt and lmom- can do her refills next week.  She last filled on 9/26 so not due yet

## 2016-10-22 ENCOUNTER — Ambulatory Visit: Payer: Commercial Managed Care - HMO | Admitting: Family Medicine

## 2016-10-22 NOTE — Telephone Encounter (Signed)
Patient [facility] appointment 10/29/16 due to the weather, patient states she didn't feel comfortable driving. Patient inquiring about medication refill mentioned below, please advise

## 2016-10-29 ENCOUNTER — Ambulatory Visit (INDEPENDENT_AMBULATORY_CARE_PROVIDER_SITE_OTHER): Payer: 59 | Admitting: Family Medicine

## 2016-10-29 VITALS — BP 153/85 | HR 71 | Temp 97.8°F | Ht 63.0 in | Wt 121.0 lb

## 2016-10-29 DIAGNOSIS — R030 Elevated blood-pressure reading, without diagnosis of hypertension: Secondary | ICD-10-CM

## 2016-10-29 DIAGNOSIS — F900 Attention-deficit hyperactivity disorder, predominantly inattentive type: Secondary | ICD-10-CM | POA: Diagnosis not present

## 2016-10-29 DIAGNOSIS — Z5181 Encounter for therapeutic drug level monitoring: Secondary | ICD-10-CM

## 2016-10-29 DIAGNOSIS — Z9109 Other allergy status, other than to drugs and biological substances: Secondary | ICD-10-CM | POA: Diagnosis not present

## 2016-10-29 DIAGNOSIS — F411 Generalized anxiety disorder: Secondary | ICD-10-CM

## 2016-10-29 MED ORDER — AMPHETAMINE-DEXTROAMPHET ER 30 MG PO CP24: 30.0000 mg | ORAL_CAPSULE | ORAL | 0 refills | Status: DC

## 2016-10-29 MED ORDER — ALPRAZOLAM 0.5 MG PO TABS: ORAL_TABLET | ORAL | 1 refills | Status: DC

## 2016-10-29 MED ORDER — AMPHETAMINE-DEXTROAMPHETAMINE 10 MG PO TABS: ORAL_TABLET | ORAL | 0 refills | Status: DC

## 2016-10-29 MED FILL — ALPRAZolam 0.5 MG TABS: 0.5 | 27 days supply | Qty: 80 | Fill #0 | Status: TO

## 2016-10-29 MED FILL — ADDERALL XR 30 MG CAP SA: 30 | 30 days supply | Qty: 30 | Fill #0

## 2016-10-29 NOTE — Progress Notes (Signed)
[facility] [address], [address] [place], [place] [address] [phone] [phone]  Date:  10/29/2016   Name:  Danielle CircleLindsay T [name]   DOB:  23-Feb-1980   MRN:  [id number]  PCP:  Pearline Cablesopland, Jessica C, MD    Chief Complaint: Follow-up (Pt here for 6 month follow up. )   History of Present Illness:  Danielle [name] is a 36 y.o. very pleasant female patient who presents with the following:  NCCSR: filled her adderall 10 on 9/26, adderall 30 on 9/9, alprazolam on 9/26 UDS is due but contract is UTD  She just had her 7th anniversary at work, she got a raise but she is really stressed.  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  She reports spending a lot of time one of one with this child which is physically and mentally taxing  BP Readings from Last 3 Encounters:  10/29/16 (!) 153/85  05/07/16 110/70  10/21/15 130/84   She is taking alprazolam BID but states that she often feels like she needs a 3rd dose during the day when she is at work  She takes 30 mg of adderall xr in the am, 10 mg around 2-3pm, alprazolam at bedtime and perhaps one during the day also  She notes that during the work day she gets more stressed She does not feel depressed at all  She [place] go [place] clinic in [place]- needs a referral   She takes 30 mg of adderall xr in the am, 10 mg around 3pm, alprazolam at bedtime and perhaps during the day also  She notes that during the work day she gets more stressed She does not feel depressed at all  UDS today- she reports that she has not taken her adderall in the last few days so it may not show up  Patient Active Problem List   Diagnosis Date Noted  . Sinus tachycardia 07/14/2012  . History of panic disorder 07/14/2012  . History of kidney stones 07/14/2012  . DDD (degenerative disc disease), lumbar 07/14/2012  . History of ovarian cyst 07/14/2012  . ADHD  (attention deficit hyperactivity disorder) 07/12/2012    Past Medical History:  Diagnosis Date  . Allergy   . Anxiety   . Gall stones   . Kidney stones     Past Surgical History:  Procedure Laterality Date  . WISDOM TOOTH EXTRACTION      Social History  Substance Use Topics  . Smoking status: Former Smoker    Packs/day: 0.50    Types: Cigarettes  . Smokeless tobacco: Never Used  . Alcohol use Yes    Family History  Problem Relation Age of Onset  . Hypertension Mother   . Hyperlipidemia Mother   . Heart disease Mother   . Hyperlipidemia Father   . Hyperlipidemia Brother   . Hyperlipidemia Brother     Allergies  Allergen Reactions  . Amoxicillin Rash  . Penicillins Rash    Medication list has been reviewed and updated.  Current Outpatient Prescriptions on File Prior to Visit  Medication Sig Dispense Refill  . meloxicam (MOBIC) 7.5 MG tablet Take 1 tablet (7.5 mg total) by mouth daily. Use as needed for pain 30 tablet 6  . metoprolol succinate (TOPROL-XL) 100 MG 24 hr tablet Take 1 tablet (100 mg total) by mouth daily. Take with or immediately  following a meal. 90 tablet 2  . OVER THE COUNTER MEDICATION OTC Mammalian Protein Allergy taking    . traMADol (ULTRAM) 50 MG tablet Take 1 tablet (50 mg total) by mouth every 8 (eight) hours as needed. For back pain 20 tablet 0   No current facility-administered medications on file prior to visit.     Review of Systems:  As per HPI- otherwise negative. No fever or chills No CP or SOB Feels well   Physical Examination: Vitals:   10/29/16 1652 10/29/16 1658  BP: (!) 149/94 (!) 153/85  Pulse: 71   Temp: 97.8 F (36.6 C)   SpO2: 100%    Vitals:   10/29/16 1652  Weight: 121 lb (54.9 kg)  Height: 5\' 3"  (1.6 m)   Body mass index is 21.43 kg/m. Ideal Body Weight: Weight in (lb) to have BMI = 25: 140.8  GEN: WDWN, NAD, Non-toxic, A & O x 3 HEENT: Atraumatic, Normocephalic. Neck supple. No masses, No  LAD. Ears and Nose: No external deformity. CV: RRR, No M/G/R. No JVD. No thrill. No extra heart sounds. PULM: CTA B, no wheezes, crackles, rhonchi. No retractions. No resp. distress. No accessory muscle use. ABD: S, NT, ND EXTR: No c/c/e NEURO Normal gait.  PSYCH: Normally interactive. Conversant. Not depressed or anxious appearing.  Calm demeanor.  Looks well, slim build  Assessment and Plan: Environmental allergies - Plan: Ambulatory referral to Allergy  Attention deficit hyperactivity disorder (ADHD), predominantly inattentive type - Plan: amphetamine-dextroamphetamine (ADDERALL XR) 30 MG 24 hr capsule, amphetamine-dextroamphetamine (ADDERALL XR) 30 MG 24 hr capsule, amphetamine-dextroamphetamine (ADDERALL XR) 30 MG 24 hr capsule, amphetamine-dextroamphetamine (ADDERALL) 10 MG tablet, amphetamine-dextroamphetamine (ADDERALL) 10 MG tablet, amphetamine-dextroamphetamine (ADDERALL) 10 MG tablet  GAD (generalized anxiety disorder) - Plan: ALPRAZolam (XANAX) 0.5 MG tablet  Medication monitoring encounter - Plan: Pain Mgmt, Profile 8 w/Conf, U  Elevated blood pressure reading  Refilled medications as above- adderall for 3 months Refilled her xanax as well- will give her 80 instead of 60 for a month supply.  Cautioned her again about risk of dependence and encouraged her to use this as little as possible UDS today She will check her BP a few times at home and let me know if higher than 145/90 She is on metoprolol for tachycardia  Meds ordered this encounter  Medications  . amphetamine-dextroamphetamine (ADDERALL XR) 30 MG 24 hr capsule    Sig: Take 1 capsule (30 mg total) by mouth every morning. Ok to fill 30 days after rx    Dispense:  30 capsule    Refill:  0  . amphetamine-dextroamphetamine (ADDERALL XR) 30 MG 24 hr capsule    Sig: Take 1 capsule (30 mg total) by mouth every morning.    Dispense:  30 capsule    Refill:  0  . amphetamine-dextroamphetamine (ADDERALL XR) 30 MG 24 hr  capsule    Sig: Take 1 capsule (30 mg total) by mouth every morning. Ok to fill 60 days after rx    Dispense:  30 capsule    Refill:  0  . amphetamine-dextroamphetamine (ADDERALL) 10 MG tablet    Sig: Take 1 in the early afternoon as needed.    Dispense:  30 tablet    Refill:  0  . amphetamine-dextroamphetamine (ADDERALL) 10 MG tablet    Sig: Take 1 tablet in the early afternoon as needed.  Fill 30 days after rx    Dispense:  30 tablet    Refill:  0  . amphetamine-dextroamphetamine (  ADDERALL) 10 MG tablet    Sig: Take one in the early afternoon as needed.  Fill 60 days after rx    Dispense:  30 tablet    Refill:  0  . ALPRAZolam (XANAX) 0.5 MG tablet    Sig: TAKE 1 TABLET BY MOUTH AT MIDDAY AND 1 TABLET AT BEDTIME.  May take 1 extra during the day if needed    Dispense:  80 tablet    Refill:  1     Signed Abbe Amsterdam, MD

## 2016-10-29 NOTE — Patient Instructions (Signed)
It was good to see you again today!  Take care and please plan to see me in about 6 months

## 2016-11-04 LAB — PAIN MGMT, PROFILE 8 W/CONF, U
6 ACETYLMORPHINE: NEGATIVE ng/mL (ref <10)
ALCOHOL METABOLITES: NEGATIVE ng/mL (ref <500)
ALPHAHYDROXYMIDAZOLAM: NEGATIVE ng/mL (ref <50)
AMINOCLONAZEPAM: NEGATIVE ng/mL (ref <25)
Alphahydroxyalprazolam: 64 ng/mL — ABNORMAL HIGH (ref <25)
Alphahydroxytriazolam: NEGATIVE ng/mL (ref <50)
Amphetamines: NEGATIVE ng/mL (ref <500)
BENZODIAZEPINES: POSITIVE ng/mL — AB (ref <100)
BUPRENORPHINE, URINE: NEGATIVE ng/mL (ref <5)
COCAINE METABOLITE: NEGATIVE ng/mL (ref <150)
Creatinine: 18.6 mg/dL — ABNORMAL LOW
Ethyl Glucuronide (ETG): NEGATIVE ng/mL (ref <500)
Ethyl Sulfate (ETS): NEGATIVE ng/mL (ref <100)
HYDROXYETHYLFLURAZEPAM: NEGATIVE ng/mL (ref <50)
LORAZEPAM: NEGATIVE ng/mL (ref <50)
MARIJUANA METABOLITE: NEGATIVE ng/mL (ref <20)
MDMA: NEGATIVE ng/mL (ref <500)
NORDIAZEPAM: NEGATIVE ng/mL (ref <50)
OPIATES: NEGATIVE ng/mL (ref <100)
Oxazepam: NEGATIVE ng/mL (ref <50)
Oxidant: NEGATIVE ug/mL (ref <200)
Oxycodone: NEGATIVE ng/mL (ref <100)
Specific Gravity: 1.006 (ref >=1.0)
TEMAZEPAM: NEGATIVE ng/mL (ref <50)
pH: 6.24 (ref 4.5–9.0)

## 2016-11-24 ENCOUNTER — Other Ambulatory Visit: Payer: Self-pay | Admitting: Family Medicine

## 2016-11-24 DIAGNOSIS — F900 Attention-deficit hyperactivity disorder, predominantly inattentive type: Secondary | ICD-10-CM

## 2016-11-24 NOTE — Telephone Encounter (Signed)
Pt request four EPI PENS called in to [facility] off [address] due to her ins running out at the end of the month. Also:  Adderall 10 mg pt wants three separate pages instead of all three scripts on one page due to when they are cut off it is too small and easy to lose.

## 2016-11-25 ENCOUNTER — Encounter: Payer: Self-pay | Admitting: Family Medicine

## 2016-11-25 MED ORDER — AMPHETAMINE-DEXTROAMPHETAMINE 10 MG PO TABS: ORAL_TABLET | ORAL | 0 refills | Status: DC

## 2016-11-25 MED ORDER — EPINEPHRINE 0.3 MG/0.3ML IJ SOAJ: 0.3000 mg | Freq: Once | INTRAMUSCULAR | 1 refills | Status: AC

## 2016-11-25 NOTE — Telephone Encounter (Signed)
Last seen here in October I filled the epipen request for her today  NCCSR:  She filled the XR 30 mg on 10/18 and 9/9 Filled the IR 10 mg 9/26 and 8/26  However looking back I did refill all of her adderall - both the 30 and the 10 mg- for 3 months in October.  Refill is not needed today I did print an rx for adderall 10 but will destroy this rx

## 2016-12-10 ENCOUNTER — Telehealth: Payer: Self-pay | Admitting: Family Medicine

## 2016-12-10 DIAGNOSIS — F900 Attention-deficit hyperactivity disorder, predominantly inattentive type: Secondary | ICD-10-CM

## 2016-12-10 IMAGING — CR DG HAND COMPLETE 3+V*L*
3 series · 3 of 3 positions shown · non-contrast
Comparison: None.

CLINICAL DATA: Left hand injury gardening.

EXAM:
LEFT HAND - COMPLETE 3+ VIEW

[PA]
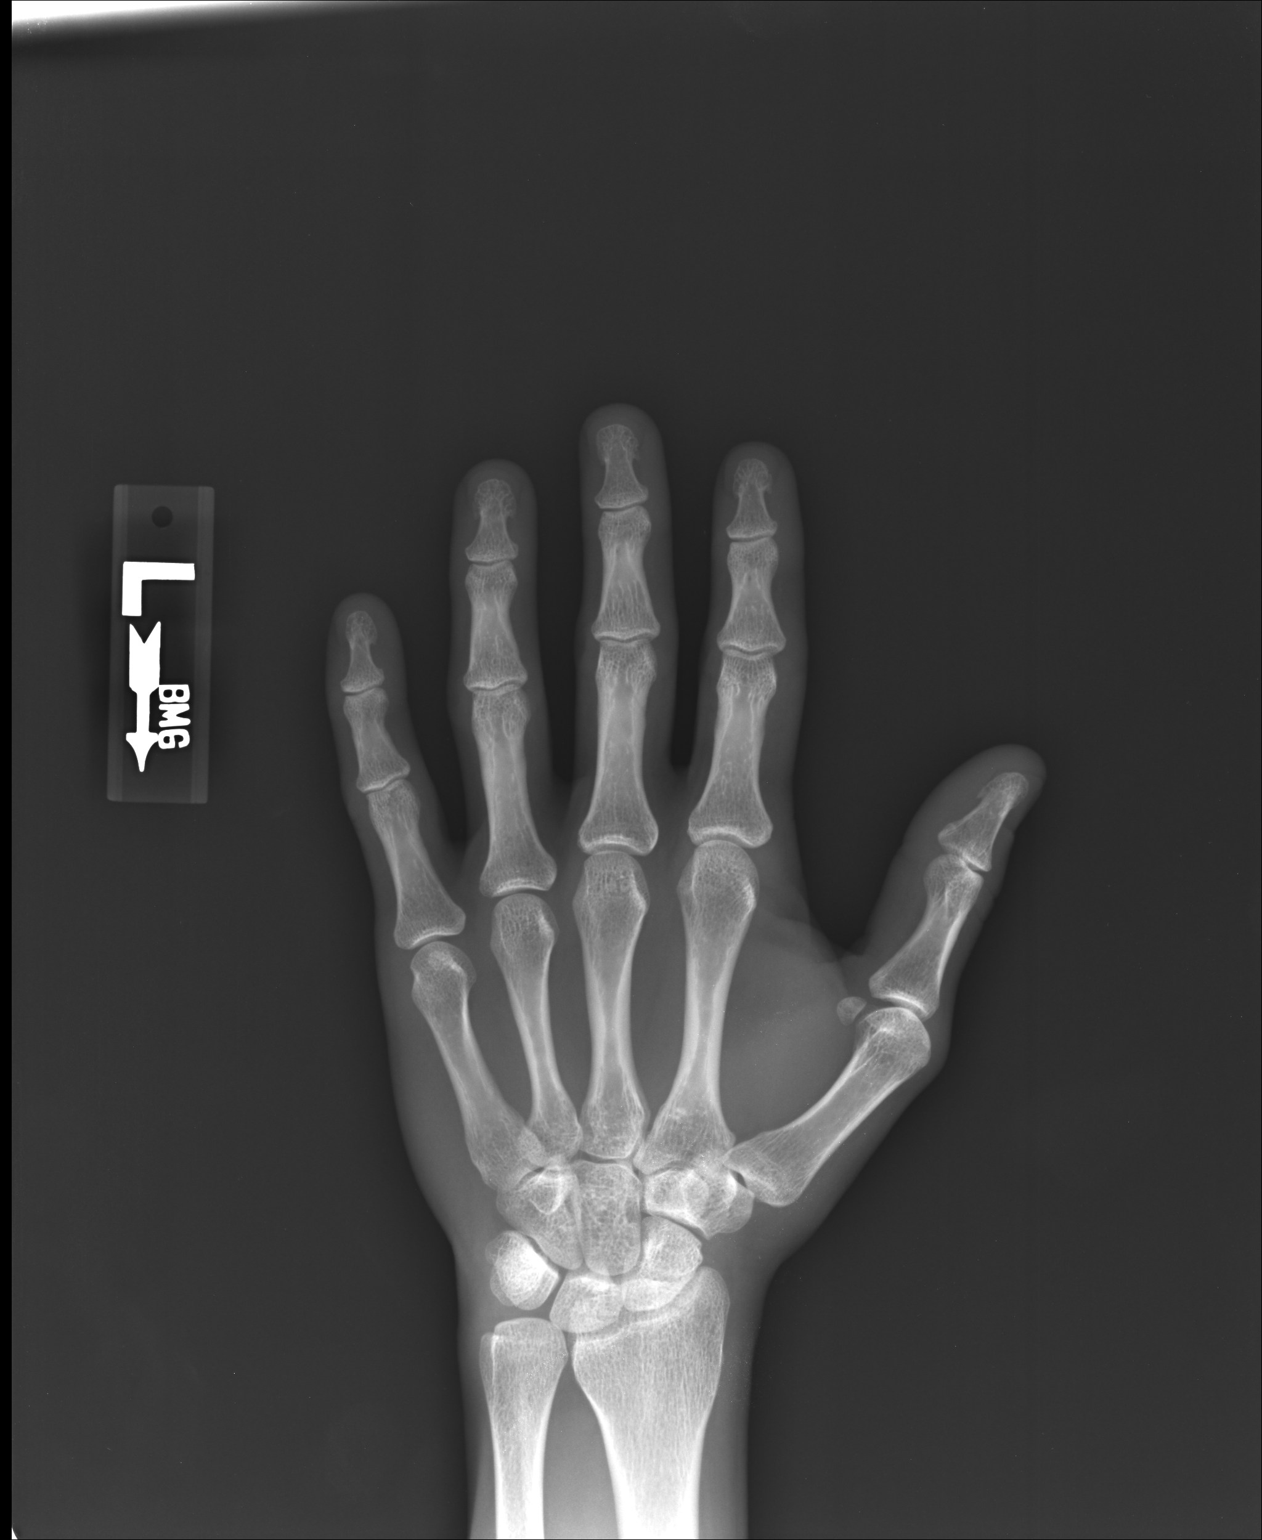

[lateral]
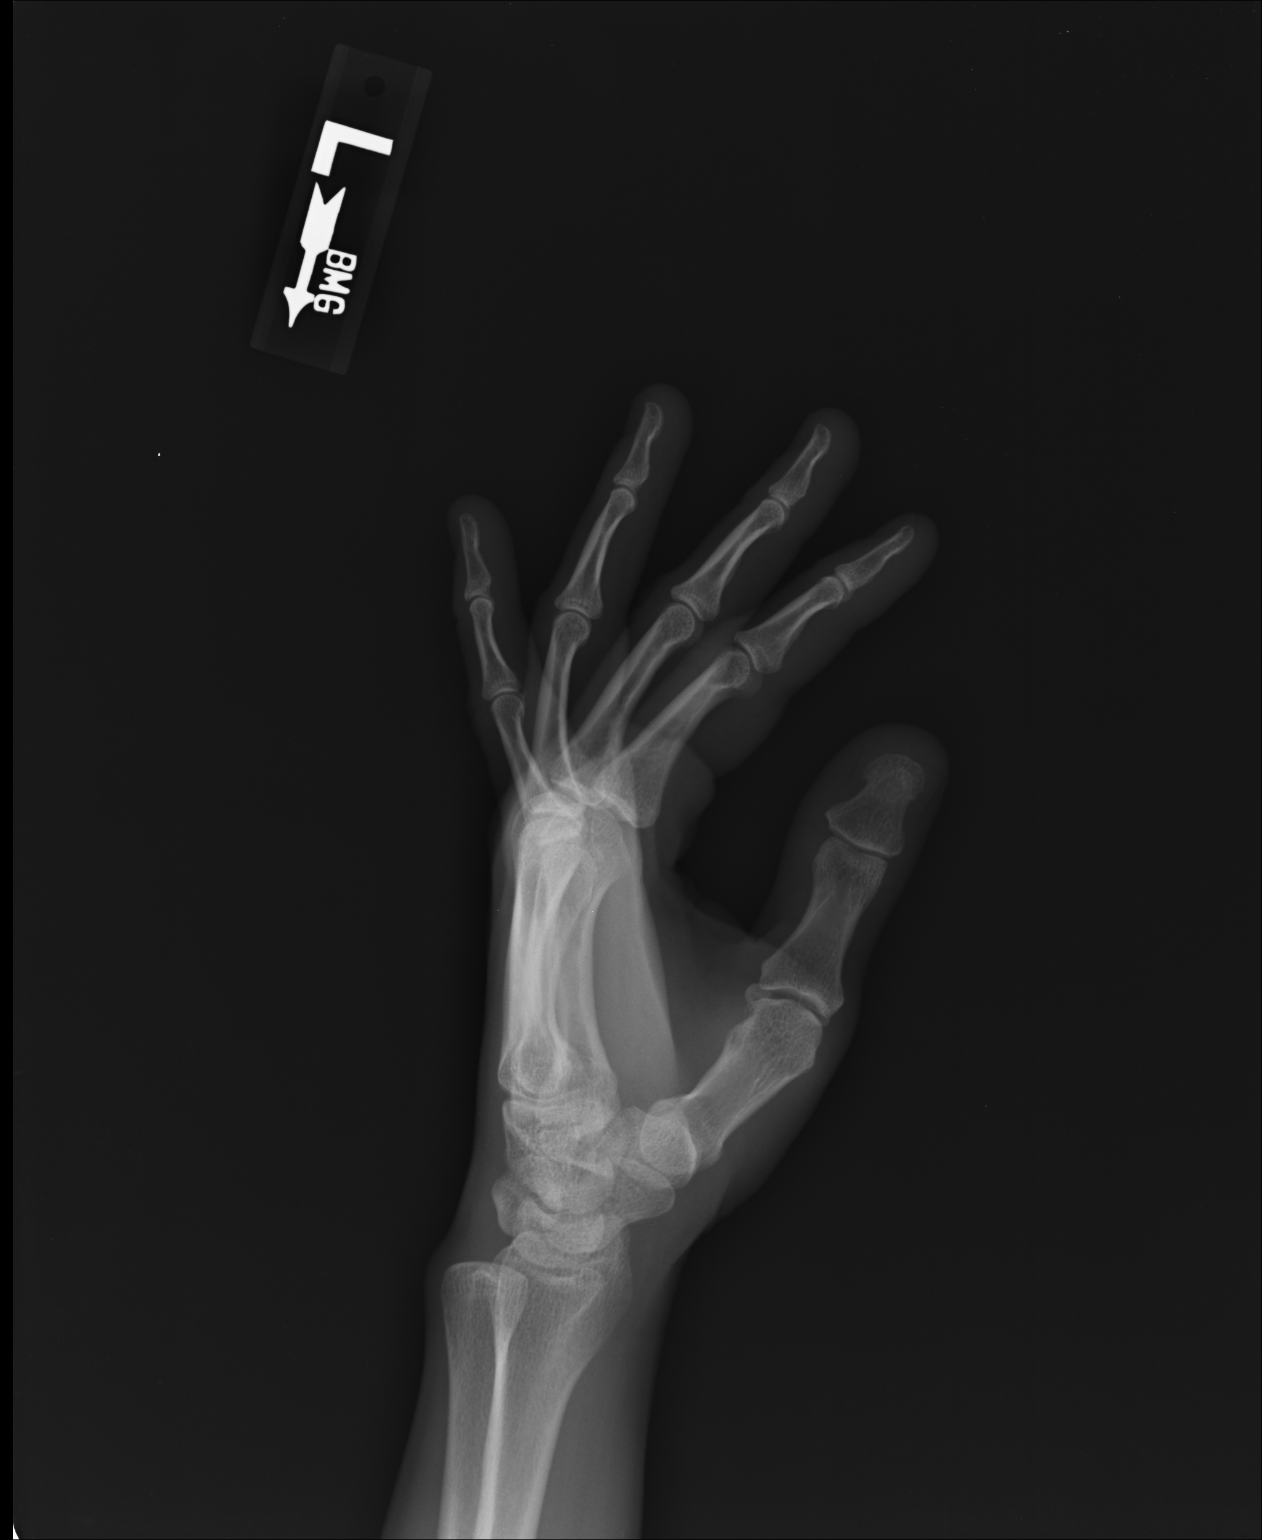

[AP]
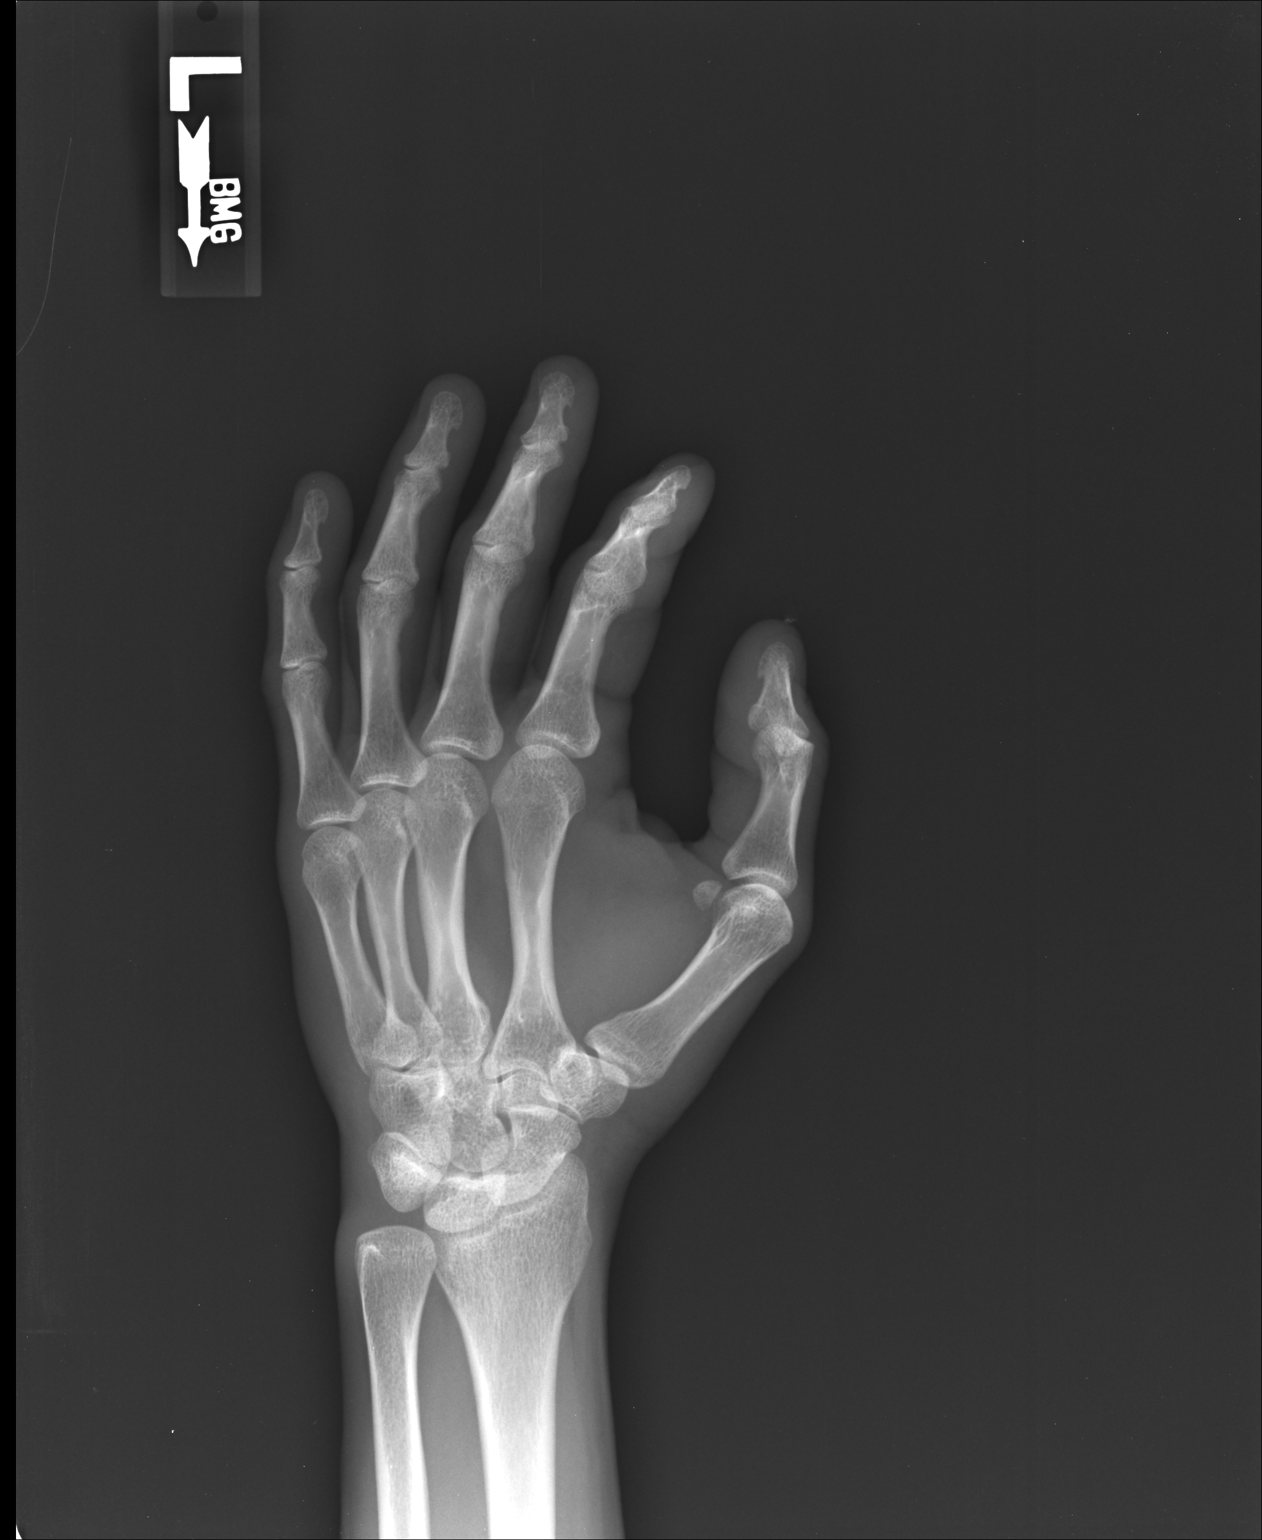

[3 of 3 positions shown; findings below may reference images not displayed]

FINDINGS: Early degenerative changes over the radiocarpal joint and the
scaphoid trapezium joint. No evidence of fracture dislocation. No
erosive changes or abnormal bony alignment.
IMPRESSION: No acute findings.

## 2016-12-10 MED ORDER — AMPHETAMINE-DEXTROAMPHETAMINE 10 MG PO TABS: ORAL_TABLET | ORAL | 0 refills | Status: DC

## 2016-12-10 NOTE — Telephone Encounter (Signed)
Copied from CRM [id number]. Topic: Quick Communication - See Telephone Encounter >> Dec 10, 2016  3:20 PM Oneal GroutSebastian, Jennifer S wrote: CRM for notification. See Telephone encounter for: requesting refill on ALPRAZolam (XANAX) 0.5 MG tablet and  (ADDERALL XR) 10 MG(patient lost written script) 3 mth supply [facility] [address]

## 2016-12-10 NOTE — Telephone Encounter (Signed)
Please advise 

## 2016-12-10 NOTE — Telephone Encounter (Signed)
Request for refill

## 2016-12-10 NOTE — Telephone Encounter (Signed)
NCCSR: she filled adderall 30 xr on 9/9, 10/18, 11/14 adderall 10  8/26, 9/26 Alprazolam 10/18, 9/26, 8/27  I last saw her on 10/18 and gave her adderall xr X3, allerall IR X3, and alprazolam #80 with a RF  Pt reports that she lost the rx for adderall IR  Will give her one rx It also looks like there was no refill on her xanax at the pharmacy.  Called to confirm but she did not have [facility]- [facility] so I will have to call them tomorrow when open

## 2016-12-11 ENCOUNTER — Encounter: Payer: Self-pay | Admitting: Family Medicine

## 2016-12-11 NOTE — Progress Notes (Unsigned)
°  Relation to pt: self  Call back number: [phone] Pharmacy: [id number] - [place], [place] - [address] [facility] [address]  Reason for call:  Patient wanted to inform PCP she found her amphetamine-dextroamphetamine (ADDERALL) 10 MG tablet Rx when she was going thru her paperwork. Patient inquiring about her ALPRAZolam (XANAX) 0.5 MG tablet refill, please advise

## 2016-12-11 NOTE — Telephone Encounter (Signed)
[facility]- they will fill refill on her xanax that is available already

## 2017-01-18 ENCOUNTER — Other Ambulatory Visit: Payer: Self-pay | Admitting: Family Medicine

## 2017-01-18 DIAGNOSIS — F411 Generalized anxiety disorder: Secondary | ICD-10-CM

## 2017-01-19 NOTE — Telephone Encounter (Signed)
Requesting: ALPRAZolam (XANAX) 0.5 MG tablet  Contract: 05/07/16 controlled substance contract signed  UDS:  no uds samlpe  Last OV: 10/29/16  Last Refill: 10/29/16  Please Advise

## 2017-01-20 ENCOUNTER — Telehealth: Payer: Self-pay

## 2017-01-20 NOTE — Telephone Encounter (Signed)
PA approved until 01/20/2018. [id number].

## 2017-01-20 NOTE — Telephone Encounter (Signed)
PA initiated via Covermymeds; KEY: [id number]. Awaiting determination.

## 2017-02-18 ENCOUNTER — Ambulatory Visit: Payer: 59 | Admitting: Family Medicine

## 2017-02-18 ENCOUNTER — Encounter: Payer: Self-pay | Admitting: Family Medicine

## 2017-02-18 VITALS — BP 122/82 | HR 85 | Temp 98.3°F | Ht 63.0 in | Wt 120.0 lb

## 2017-02-18 DIAGNOSIS — F900 Attention-deficit hyperactivity disorder, predominantly inattentive type: Secondary | ICD-10-CM

## 2017-02-18 DIAGNOSIS — F411 Generalized anxiety disorder: Secondary | ICD-10-CM | POA: Diagnosis not present

## 2017-02-18 DIAGNOSIS — R062 Wheezing: Secondary | ICD-10-CM | POA: Diagnosis not present

## 2017-02-18 DIAGNOSIS — R6889 Other general symptoms and signs: Secondary | ICD-10-CM

## 2017-02-18 LAB — POCT INFLUENZA A/B
Influenza A, POC: NEGATIVE
Influenza B, POC: NEGATIVE

## 2017-02-18 MED ORDER — ALBUTEROL SULFATE 108 (90 BASE) MCG/ACT IN AEPB: 2.0000 | INHALATION_SPRAY | Freq: Three times a day (TID) | RESPIRATORY_TRACT | 3 refills | Status: DC | PRN

## 2017-02-18 MED ORDER — AMPHETAMINE-DEXTROAMPHET ER 30 MG PO CP24: 30.0000 mg | ORAL_CAPSULE | ORAL | 0 refills | Status: DC

## 2017-02-18 MED ORDER — AMPHETAMINE-DEXTROAMPHETAMINE 10 MG PO TABS: ORAL_TABLET | ORAL | 0 refills | Status: DC

## 2017-02-18 MED ORDER — ALPRAZOLAM 0.5 MG PO TABS: ORAL_TABLET | ORAL | 0 refills | Status: DC

## 2017-02-18 NOTE — Progress Notes (Signed)
[facility] [address], [address] [place], [place] [address] [phone] [phone]  Date:  02/18/2017   Name:  Danielle [name]   DOB:  [birth year]   MRN:  [id number]  PCP:  Pearline Cablesopland, Jessica C, MD    Chief Complaint: Follow-up (Pt here for f/u visit )   History of Present Illness:  Danielle [name] is a 37 y.o. very pleasant female patient who presents with the following:  History of ADHD and panic disorder.  Here today for a medication check She uses XR adderall 30 mg in the morning, and takes 10 mg of IR later in the day as well Filled her XR on 1/11 Filled her IR on 1/9  Last filled xanax on 1/08 UDS done on 10/18 Contract is signed  She recently sold her home,and is moving in with her GF in the next few months.  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  No adverse SE of her medication noted Weight is stable from last visit.  She is not as thin as she was a year ago  Wt Readings from Last 3 Encounters:  02/18/17 120 lb (54.4 kg)  10/29/16 121 lb (54.9 kg)  05/07/16 109 lb 6.4 oz (49.6 kg)    Patient Active Problem List   Diagnosis Date Noted  . Sinus tachycardia 07/14/2012  . History of panic disorder 07/14/2012  . History of kidney stones 07/14/2012  . DDD (degenerative disc disease), lumbar 07/14/2012  . History of ovarian cyst 07/14/2012  . ADHD (attention deficit hyperactivity disorder) 07/12/2012     Past Medical History:  Diagnosis Date  . Allergy   . Anxiety   . Gall stones   . Kidney stones     Past Surgical History:  Procedure Laterality Date  . WISDOM TOOTH EXTRACTION      Social History   Tobacco Use  . Smoking status: Former Smoker    Packs/day: 0.50    Types: Cigarettes  . Smokeless tobacco: Never Used  Substance Use Topics  . Alcohol use: Yes  . Drug use: No    Family History  Problem Relation Age of Onset  . Hypertension Mother   . Hyperlipidemia Mother   . Heart disease Mother   . Hyperlipidemia Father   . Hyperlipidemia Brother   . Hyperlipidemia Brother     Allergies  Allergen Reactions  . Amoxicillin Rash  . Penicillins Rash    Medication list has been reviewed and updated.  Current Outpatient Medications on File Prior to Visit  Medication Sig Dispense Refill  . meloxicam (MOBIC) 7.5 MG tablet Take 1 tablet (7.5 mg total) by mouth daily. Use as needed for pain 30 tablet 6  . metoprolol succinate (TOPROL-XL) 100 MG 24 hr tablet Take 1 tablet (100 mg total) by mouth daily.  Take with or immediately following a meal. 90 tablet 2  . OVER THE COUNTER MEDICATION OTC Mammalian Protein Allergy taking    . traMADol (ULTRAM) 50 MG tablet Take 1 tablet (50 mg total) by mouth every 8 (eight) hours as needed. For back pain 20 tablet 0   No current facility-administered medications on file prior to visit.     Review of Systems:  As per HPI- otherwise negative.   Physical Examination: Vitals:   02/18/17 1550  BP: 122/82  Pulse: 85  Temp: 98.3 F (36.8 C)  SpO2: 99%   Vitals:   02/18/17 1550  Weight: 120 lb (54.4 kg)  Height: 5\' 3"  (1.6 m)   Body mass index is 21.26 kg/m. Ideal Body Weight: Weight in (lb) to have BMI = 25: 140.8  GEN: WDWN, NAD, Non-toxic, A & O x 3 HEENT: Atraumatic, Normocephalic. Neck supple. No masses, No LAD. Ears and Nose: No external deformity. CV: RRR, No M/G/R. No JVD. No thrill. No extra heart  sounds. PULM: CTA B, no wheezes, crackles, rhonchi. No retractions. No resp. distress. No accessory muscle use. ABD: S, NT, ND, +BS. No rebound. No HSM. EXTR: No c/c/e NEURO Normal gait.  PSYCH: Normally interactive. Conversant. Not depressed or anxious appearing.  Calm demeanor.   Results for orders placed or performed in visit on 02/18/17  POCT Influenza A/B  Result Value Ref Range   Influenza A, POC Negative Negative   Influenza B, POC Negative Negative    Assessment and Plan: Attention deficit hyperactivity disorder (ADHD), predominantly inattentive type - Plan: amphetamine-dextroamphetamine (ADDERALL XR) 30 MG 24 hr capsule, amphetamine-dextroamphetamine (ADDERALL XR) 30 MG 24 hr capsule, amphetamine-dextroamphetamine (ADDERALL XR) 30 MG 24 hr capsule, amphetamine-dextroamphetamine (ADDERALL) 10 MG tablet, amphetamine-dextroamphetamine (ADDERALL) 10 MG tablet, amphetamine-dextroamphetamine (ADDERALL) 10 MG tablet  GAD (generalized anxiety disorder) - Plan: ALPRAZolam (XANAX) 0.5 MG tablet  Wheezing - Plan: Albuterol Sulfate (PROAIR RESPICLICK) 108 (90 Base) MCG/ACT AEPB  Flu-like symptoms - Plan: POCT Influenza A/B  Here today with flu like sx, now resolving.  Flu test negative.  Continue supportive care, alert me if not continuing to improve Refilled her albuterol which she uses as needed Refilled her xanax and adderall today as well Will need office visit in 6 months for follow-up   Signed Abbe Amsterdam, MD

## 2017-03-19 ENCOUNTER — Other Ambulatory Visit: Payer: Self-pay | Admitting: Family Medicine

## 2017-03-19 DIAGNOSIS — F411 Generalized anxiety disorder: Secondary | ICD-10-CM

## 2017-03-19 NOTE — Telephone Encounter (Signed)
Requesting: ALPRAZolam (XANAX) 0.5 MG tablet  Contract: 05/07/16 controlled substance contract signed, no uds samlpe.  Last OV: 02/18/17 Last Refill: 02/18/17  Please Advise

## 2017-03-20 NOTE — Telephone Encounter (Signed)
Reviewed NCCSR- ok to refill Last UDS in October

## 2017-05-11 ENCOUNTER — Other Ambulatory Visit: Payer: Self-pay | Admitting: Family Medicine

## 2017-05-11 DIAGNOSIS — F900 Attention-deficit hyperactivity disorder, predominantly inattentive type: Secondary | ICD-10-CM

## 2017-05-11 DIAGNOSIS — F411 Generalized anxiety disorder: Secondary | ICD-10-CM

## 2017-05-11 NOTE — Telephone Encounter (Signed)
Copied from CRM [id number]. Topic: Quick Communication - Rx Refill/Question >> May 11, 2017  3:27 PM Clack, Princella Pellegrini wrote: Medication: amphetamine-dextroamphetamine (ADDERALL XR) 30 MG 24 hr capsule [[id number]] and amphetamine-dextroamphetamine (ADDERALL) 10 MG tablet [[id number]]  Has the patient contacted their pharmacy? Yes.   (Agent: If no, request that the patient contact the pharmacy for the refill.) Preferred Pharmacy (with phone number or street name): [id number] - [address] [place], [place] - [address] AT [facility] [address] [phone] (Phone) [phone] (Fax)     Agent: Please be advised that RX refills may take up to 3 business days. We ask that you follow-up with your pharmacy.

## 2017-05-12 NOTE — Telephone Encounter (Signed)
Rx refill request: Adderall XR 30 mg 24 hr  and   Adderall 10 mg     Patient gets 3 Rx for each- last printed 02/18/17 #30  LOV: 02/18/17  PCP: [place]  Pharmacy: verified

## 2017-05-12 NOTE — Telephone Encounter (Signed)
Received refill request for Adderall XR  and Adderall 

## 2017-05-13 MED ORDER — AMPHETAMINE-DEXTROAMPHETAMINE 10 MG PO TABS: ORAL_TABLET | ORAL | 0 refills | Status: DC

## 2017-05-13 MED ORDER — AMPHETAMINE-DEXTROAMPHET ER 30 MG PO CP24: 30.0000 mg | ORAL_CAPSULE | ORAL | 0 refills | Status: DC

## 2017-05-13 NOTE — Telephone Encounter (Signed)
Received refill request for ALPRAZolam (XANAX) 0.5 MG tablet. Last office visit 02/18/17 and last refill 03/20/17.

## 2017-05-13 NOTE — Telephone Encounter (Signed)
She uses XR adderall 30 mg in the morning, and takes 10 mg of IR later in the day  NCCSR:  04/16/2017  5  03/20/2017  Alprazolam 0.5 Mg Tablet  80 27 Je Cop  [id number]  Wal (4341)  1 2.96 LME Comm Ins  [place]  04/16/2017  5  02/18/2017  Adderall Xr 30 Mg Capsule  30 30 Je Cop  [id number]  Wal (4341)  0  Comm Ins  [place]  04/16/2017  5  02/18/2017  Dextroamp-Amphetamin 10 Mg Tab  30 30 Je Cop  [id number]  Wal (4341)  0  Comm Ins  [place]  03/20/2017  5  03/20/2017  Alprazolam 0.5 Mg Tablet  80 27 Je Cop  [id number]  Wal (4341)  0 2.96 LME Comm Ins  [place]  03/19/2017  5  02/18/2017  Dextroamp-Amphetamin 10 Mg Tab  30 30 Je Cop  [id number]  Wal (4341)  0  Comm Ins  [place]  03/19/2017  5  02/18/2017  Adderall Xr 30 Mg Capsule  30 30 Je Cop  [id number]  Wal (4341)  0  Comm Ins  [place]  02/18/2017  5  02/18/2017  Dextroamp-Amphetamin 10 Mg Tab  30 30 Je Cop  [id number]  Wal (4341)  0  Comm Ins  [place]  02/18/2017  5  02/18/2017  Adderall Xr 30 Mg Capsule  30 30 Je Cop  [id number]  Wal (4341)  0  Comm Ins    02/18/2017  5  02/18/2017  Alprazolam 0.5 Mg Tablet  80 26 Je Cop  [id number]  Wal (4341)  0      Last visit here in February Contract is signed UDS signed last fall   Ok to refill

## 2017-05-14 NOTE — Telephone Encounter (Signed)
Reviewed NCCSR, ok to refill  

## 2017-05-19 ENCOUNTER — Other Ambulatory Visit: Payer: Self-pay | Admitting: Emergency Medicine

## 2017-05-19 ENCOUNTER — Encounter: Payer: Self-pay | Admitting: Family Medicine

## 2017-05-19 DIAGNOSIS — F900 Attention-deficit hyperactivity disorder, predominantly inattentive type: Secondary | ICD-10-CM

## 2017-05-19 NOTE — Telephone Encounter (Signed)
-----   Message from Cammy Copa, [place] sent at 05/19/2017  9:34 AM EDT ----- Regarding: Adderall RX Received fax from  [facility] that state, "Pt does not want the adderall rx sent to [id number] - [place], [place] - [address] AT [facility] OF [address]  Send to [place], [place] instead.

## 2017-05-20 MED ORDER — AMPHETAMINE-DEXTROAMPHET ER 30 MG PO CP24: 30.0000 mg | ORAL_CAPSULE | ORAL | 0 refills | Status: DC

## 2017-05-20 MED ORDER — AMPHETAMINE-DEXTROAMPHETAMINE 10 MG PO TABS: ORAL_TABLET | ORAL | 0 refills | Status: DC

## 2017-05-20 NOTE — Telephone Encounter (Signed)
[facility] to [facility], [facility]

## 2017-06-09 ENCOUNTER — Other Ambulatory Visit: Payer: Self-pay | Admitting: Family Medicine

## 2017-06-09 DIAGNOSIS — R000 Tachycardia, unspecified: Secondary | ICD-10-CM

## 2017-06-22 ENCOUNTER — Other Ambulatory Visit: Payer: Self-pay | Admitting: Family Medicine

## 2017-06-22 DIAGNOSIS — R000 Tachycardia, unspecified: Secondary | ICD-10-CM

## 2017-07-07 ENCOUNTER — Other Ambulatory Visit: Payer: Self-pay | Admitting: Family Medicine

## 2017-07-07 DIAGNOSIS — F411 Generalized anxiety disorder: Secondary | ICD-10-CM

## 2017-07-07 NOTE — Telephone Encounter (Signed)
Requesting:Alprazolam Contract:none, needs csc UDS:04/20/16 needs updated uds Last Visit:02/18/17 Next Visit:none Last Refill:05/14/17  No discrepancies.   Please Advise

## 2017-07-08 NOTE — Telephone Encounter (Signed)
Reviewed NCCSR: 06/14/2017  5  05/20/2017  Adderall Xr 30 Mg Capsule  30 30 Je Cop  [id number]  Wal ([id number])  0  Comm Ins  [place]  06/14/2017  5  05/20/2017  Dextroamp-Amphetamin 10 Mg Tab  30 30 Je Cop  [id number]  Wal (4341)  0  Comm Ins  [place]  06/14/2017  5  05/14/2017  Alprazolam 0.5 Mg Tablet  80 27 Je Cop  [id number]  Wal (4341)  0 2.96 LME Comm Ins  [place]  05/13/2017  5  05/13/2017  Adderall Xr 30 Mg Capsule  30 30 Je Cop  [id number]  Wal (1228)  0  Comm Ins  [place]  05/13/2017  5  05/13/2017  Dextroamp-Amphetamin 10 Mg Tab  30 30 Je Cop  [id number]  Wal (1228)  0  Comm Ins  [place]  05/11/2017  5  03/20/2017  Alprazolam 0.5 Mg Tablet  80 27 Je Cop  [id number]  Wal (4341)  2 2.96 LME Comm Ins  [place]  04/16/2017  5  03/20/2017  Alprazolam 0.5 Mg Tablet  80 27 Je Cop  [id number]  Wal (4341)  1     Ok to refill xanax today - note to pharm that pt has requested a few days early, hold until due

## 2017-08-09 ENCOUNTER — Encounter: Payer: Self-pay | Admitting: Family Medicine

## 2017-08-09 ENCOUNTER — Other Ambulatory Visit: Payer: Self-pay | Admitting: Family Medicine

## 2017-08-09 DIAGNOSIS — F900 Attention-deficit hyperactivity disorder, predominantly inattentive type: Secondary | ICD-10-CM

## 2017-08-09 MED ORDER — AMPHETAMINE-DEXTROAMPHET ER 30 MG PO CP24: 30.0000 mg | ORAL_CAPSULE | ORAL | 0 refills | Status: DC

## 2017-08-09 NOTE — Telephone Encounter (Signed)
Requesting:Adderall Contract:none, needs csc UDS:10/29/16 Last Visit:02/18/17 Next Visit:none Last Refill:05/13/17 3 prescriptions  Please Advise

## 2017-08-09 NOTE — Telephone Encounter (Signed)
Reviewed NCCSR- ok to refill but she needs to come in for an OV.  Message to pt

## 2017-08-09 NOTE — Telephone Encounter (Signed)
Copied from CRM [id number]. Topic: Quick Communication - Rx Refill/Question >> Aug 09, 2017 11:36 AM Floria RavelingStovall, Shana A wrote: Medication: amphetamine-dextroamphetamine (ADDERALL XR) 30 MG 24 hr capsule [[id number]][[id number]]  and the 10MG   Has the patient contacted their pharmacy? No  (Agent: If no, request that the patient contact the pharmacy for the refill.) (Agent: If yes, when and what did the pharmacy advise?)  Preferred Pharmacy (with phone number or street name): [id number] [place] - [address] AT [facility] [address] [phone] (Phone)   Agent: Please be advised that RX refills may take up to 3 business days. We ask that you follow-up with your pharmacy.

## 2017-08-23 ENCOUNTER — Other Ambulatory Visit: Payer: Self-pay

## 2017-08-23 ENCOUNTER — Encounter: Payer: Self-pay | Admitting: Family Medicine

## 2017-08-23 DIAGNOSIS — F900 Attention-deficit hyperactivity disorder, predominantly inattentive type: Secondary | ICD-10-CM

## 2017-08-23 MED ORDER — AMPHETAMINE-DEXTROAMPHETAMINE 10 MG PO TABS: ORAL_TABLET | ORAL | 0 refills | Status: DC

## 2017-08-23 NOTE — Telephone Encounter (Signed)
Requesting:Adderall  Contract:none, needs csc UDS:10/29/16 Last Visit:02/18/17 Next Visit:08/26/17 Last Refill:05/13/17 3 prescriptions  Please Advise

## 2017-08-24 NOTE — Progress Notes (Addendum)
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  Here today for a medication check She uses XR adderall 30 mg in the morning, and takes 10 mg of IR later in the day as well She takes the 30 first thing and the 10 around 2:30 or 3 pm She is taking alprazolam at lunch and at bedtime - on occasion will use a 3rd dose at some point during the day UDS done on 10/18-would like to update today while she is here  Contract is signed  I last saw her in February of this year; She recently sold her home,and is moving in with her GF in the next few months.  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  NCCSR:   08/09/2017  5  08/09/2017  Adderall Xr 30 Mg Capsule  30.00 30 Je Cop  [id number]  Wal (1228)  1/1  Comm Ins  [place]  08/08/2017  5  07/08/2017  Alprazolam 0.5 Mg Tablet  80.00 27 Je Cop  [id number]  Wal (4341)  2/1 2.96 LME Comm Ins  [place]  07/11/2017  5  05/20/2017  Adderall Xr 30 Mg Capsule  30.00 30 Je Cop  [id number]  Wal (4341)  1/1  Comm Ins  [place]   07/11/2017  5  05/20/2017  Dextroamp-Amphetamin 10 Mg Tab  30.00 30 Je Cop  [id number]  Wal (4341)  1/1  Comm Ins  [place]  07/08/2017  5  07/08/2017  Alprazolam 0.5 Mg Tablet  80.00 27 Je Cop  [id number]  Wal (4341)  1/1 2.96 LME Comm Ins  [place]  06/14/2017  5  05/20/2017  Adderall Xr 30 Mg Capsule  30.00 30 Je Cop  [id number]  Wal (4341)  1/1  Comm Ins  [place]  06/14/2017  5  05/20/2017  Dextroamp-Amphetamin 10 Mg Tab  30.00 30 Je Cop  [id number]  Wal (4341)  1/1  Comm Ins  [place]  06/14/2017  5  05/14/2017  Alprazolam 0.5 Mg Tablet  80.00 27 Je Cop  [id number]  Wal (4341)  1/1 2.96 LME Comm Ins  [place]  05/13/2017  5  05/13/2017  Adderall Xr 30 Mg Capsule  30.00 30 Je Cop  [id number]  Wal (1228)  1/1  Comm Ins    05/13/2017  5  05/13/2017  Dextroamp-Amphetamin 10 Mg Tab  30.00 30 Je Cop  [id number]  Wal (1228)  1/1  Comm Ins  [place]  05/11/2017  5  03/20/2017  Alprazolam 0.5 Mg Tablet  80.00 27 Je Cop  [id number]  Wal (4341)  3/2 2.96 LME Comm Ins  [place]  04/16/2017  5  03/20/2017  Alprazolam 0.5 Mg Tablet  80.00 27 Je Cop  [id number]  Wal (4341)  2/2 2.96 LME Comm Ins  [place]  04/16/2017  5  02/18/2017  Adderall Xr 30 Mg Capsule  30.00 30 Je Cop  [id number]  Wal (4341)  1/1  Comm Ins  [place]  04/16/2017  5  02/18/2017  Dextroamp-Amphetamin 10 Mg Tab  30.00 30 Je Cop  [id number]  Wal (4341)  1/1      [facility] and will call them to schedule her follow up visit and pap if needed She notes that her menses have come 'twice per month" for the last couple of months  She would like to do routine labs today    Patient Active Problem List   Diagnosis Date Noted  . Sinus tachycardia 07/14/2012  . History of panic disorder 07/14/2012  . History of kidney stones 07/14/2012  . DDD (degenerative disc disease), lumbar 07/14/2012  . History of ovarian cyst 07/14/2012  . ADHD (attention deficit hyperactivity disorder) 07/12/2012    Past Medical History:  Diagnosis Date  . Allergy   . Anxiety   . Gall stones   . Kidney stones     Past Surgical  History:  Procedure Laterality Date  . WISDOM TOOTH EXTRACTION      Social History   Tobacco Use  . Smoking status: Former Smoker    Packs/day: 0.50    Types: Cigarettes  . Smokeless tobacco: Never Used  Substance Use Topics  . Alcohol use: Yes  . Drug use: No    Family History  Problem Relation Age of Onset  . Hypertension Mother   . Hyperlipidemia Mother   . Heart disease Mother   . Hyperlipidemia Father   . Hyperlipidemia Brother   . Hyperlipidemia Brother     Allergies  Allergen Reactions  . Beef Extract Anaphylaxis  . Amoxicillin Rash  . Penicillins Rash    Medication list has been reviewed and updated.  Current Outpatient Medications on File Prior to Visit  Medication Sig Dispense Refill  . Albuterol Sulfate (PROAIR RESPICLICK) 108 (90 Base) MCG/ACT AEPB Inhale 2 puffs into the lungs 3 (three) times daily as needed. 1 each 3  . ALPRAZolam (XANAX) 0.5 MG tablet TAKE 1 TABLET BY MOUTH DAILY AT MIDDAY AND AT BEDTIME. MAY TAKE 1 EXTRA DURING DAY AS NEEDED 80 tablet 1  . amphetamine-dextroamphetamine (ADDERALL XR) 30 MG 24 hr capsule Take 1 capsule (30 mg total) by mouth every morning. Ok to fill 30 days after rx 30 capsule 0  . amphetamine-dextroamphetamine (ADDERALL XR) 30 MG 24 hr capsule Take 1 capsule (30 mg total) by mouth every morning. Ok to fill 60 days after rx 30 capsule 0  . amphetamine-dextroamphetamine (ADDERALL XR) 30 MG 24 hr capsule Take 1 capsule (30 mg total) by mouth every morning. 30 capsule 0  . amphetamine-dextroamphetamine (ADDERALL) 10 MG tablet Take one in the early afternoon as needed.  Fill 30 days after rx 30 tablet 0  . amphetamine-dextroamphetamine (ADDERALL) 10 MG tablet Take 1 in the early afternoon as needed.  Fill 60 days after rx 30 tablet 0  . amphetamine-dextroamphetamine (ADDERALL) 10 MG tablet Take 1 in the early afternoon as needed. 30 tablet  0  . meloxicam (MOBIC) 7.5 MG tablet Take 1 tablet (7.5 mg total) by mouth daily. Use as  needed for pain 30 tablet 6  . metoprolol succinate (TOPROL-XL) 100 MG 24 hr tablet TAKE 1 TABLET BY MOUTH DAILY TAKE WITH OR IMMEDIATELY FOLLOWING A MEAL 90 tablet 0  . metoprolol succinate (TOPROL-XL) 100 MG 24 hr tablet TAKE 1 TABLET BY MOUTH DAILY TAKE WITH OR IMMEDIATELY FOLLOWING A MEAL 90 tablet 0  . OVER THE COUNTER MEDICATION OTC Mammalian Protein Allergy taking    . traMADol (ULTRAM) 50 MG tablet Take 1 tablet (50 mg total) by mouth every 8 (eight) hours as needed. For back pain 20 tablet 0   No current facility-administered medications on file prior to visit.     Review of Systems:  As per HPI- otherwise negative.   Physical Examination: Vitals:   08/26/17 1624  BP: 134/88  Pulse: 81  Resp: 16  SpO2: 100%   Vitals:   08/26/17 1624  Weight: 128 lb (58.1 kg)  Height: 5\' 3"  (1.6 m)   Body mass index is 22.67 kg/m. Ideal Body Weight: Weight in (lb) to have BMI = 25: 140.8  GEN: WDWN, NAD, Non-toxic, A & O x 3, looks well, has gained a little weight, she has been overly thin in the past and appears healthy  HEENT: Atraumatic, Normocephalic. Neck supple. No masses, No LAD.  Bilateral TM wnl, oropharynx normal.  PEERL,EOMI.   Ears and Nose: No external deformity. CV: RRR, No M/G/R. No JVD. No thrill. No extra heart sounds. PULM: CTA B, no wheezes, crackles, rhonchi. No retractions. No resp. distress. No accessory muscle use. ABD: S, NT, ND EXTR: No c/c/e NEURO Normal gait.  PSYCH: Normally interactive. Conversant. Not depressed or anxious appearing.  Calm demeanor.    Assessment and Plan: Screening for thyroid disorder - Plan: TSH  Screening for hyperlipidemia - Plan: Lipid panel  Screening for deficiency anemia - Plan: CBC  Screening for diabetes mellitus - Plan: Hemoglobin A1c, Comprehensive metabolic panel  Attention deficit hyperactivity disorder (ADHD), predominantly inattentive type - Plan: Pain Mgmt, Profile 8 w/Conf, U, amphetamine-dextroamphetamine  (ADDERALL XR) 30 MG 24 hr capsule, amphetamine-dextroamphetamine (ADDERALL XR) 30 MG 24 hr capsule, amphetamine-dextroamphetamine (ADDERALL XR) 30 MG 24 hr capsule, amphetamine-dextroamphetamine (ADDERALL) 10 MG tablet, amphetamine-dextroamphetamine (ADDERALL) 10 MG tablet  GAD (generalized anxiety disorder) - Plan: Pain Mgmt, Profile 8 w/Conf, U, ALPRAZolam (XANAX) 0.5 MG tablet  Tachycardia - Plan: metoprolol succinate (TOPROL-XL) 100 MG 24 hr tablet  Refilled her meds today as above Labs pending- Will plan further follow- up pending labs. She will see her OBG about irregular menses, check TSH today  Will plan further follow- up pending labs.   Signed Abbe AmsterdamJessica Jasmeet Manton, MD  Received her labs 8/16 Results for orders placed or performed in visit on 08/26/17  CBC  Result Value Ref Range   WBC 8.8 4.0 - 10.5 K/uL   RBC 3.74 (L) 3.87 - 5.11 Mil/uL   Platelets 417.0 (H) 150.0 - 400.0 K/uL   Hemoglobin 12.2 12.0 - 15.0 g/dL   HCT 40.935.9 (L) 81.136.0 - 91.446.0 %   MCV 95.8 78.0 - 100.0 fl   MCHC 34.0 30.0 - 36.0 g/dL   RDW 78.213.1 95.611.5 - 21.315.5 %  Hemoglobin A1c  Result Value Ref Range   Hgb A1c MFr Bld 5.5 4.6 - 6.5 %  Lipid panel  Result Value Ref Range   Cholesterol 172 0 - 200 mg/dL   Triglycerides 08.698.0 0.0 -  149.0 mg/dL   HDL 40.98 >11.91 mg/dL   VLDL 47.8 0.0 - 29.5 mg/dL   LDL Cholesterol 91 0 - 99 mg/dL   Total CHOL/HDL Ratio 3    NonHDL 111.05   TSH  Result Value Ref Range   TSH 2.18 0.35 - 4.50 uIU/mL  Comprehensive metabolic panel  Result Value Ref Range   Sodium 140 135 - 145 mEq/L   Potassium 4.6 3.5 - 5.1 mEq/L   Chloride 104 96 - 112 mEq/L   CO2 30 19 - 32 mEq/L   Glucose, Bld 91 70 - 99 mg/dL   BUN 13 6 - 23 mg/dL   Creatinine, Ser 6.21 0.40 - 1.20 mg/dL   Total Bilirubin 0.2 0.2 - 1.2 mg/dL   Alkaline Phosphatase 39 39 - 117 U/L   AST 10 0 - 37 U/L   ALT 11 0 - 35 U/L   Total Protein 6.8 6.0 - 8.3 g/dL   Albumin 4.7 3.5 - 5.2 g/dL   Calcium 30.8 8.4 - 65.7 mg/dL    GFR 84.69 >62.95 mL/min   Message to pt

## 2017-08-26 ENCOUNTER — Encounter: Payer: Self-pay | Admitting: Family Medicine

## 2017-08-26 ENCOUNTER — Ambulatory Visit: Payer: 59 | Admitting: Family Medicine

## 2017-08-26 VITALS — BP 134/88 | HR 81 | Resp 16 | Ht 63.0 in | Wt 128.0 lb

## 2017-08-26 DIAGNOSIS — Z1329 Encounter for screening for other suspected endocrine disorder: Secondary | ICD-10-CM | POA: Diagnosis not present

## 2017-08-26 DIAGNOSIS — Z130 Encounter for screening for diseases of the blood and blood-forming organs and certain disorders involving the immune mechanism: Secondary | ICD-10-CM

## 2017-08-26 DIAGNOSIS — R000 Tachycardia, unspecified: Secondary | ICD-10-CM

## 2017-08-26 DIAGNOSIS — Z13220 Encounter for screening for lipoid disorders: Secondary | ICD-10-CM

## 2017-08-26 DIAGNOSIS — Z131 Encounter for screening for diabetes mellitus: Secondary | ICD-10-CM | POA: Diagnosis not present

## 2017-08-26 DIAGNOSIS — F900 Attention-deficit hyperactivity disorder, predominantly inattentive type: Secondary | ICD-10-CM

## 2017-08-26 DIAGNOSIS — F411 Generalized anxiety disorder: Secondary | ICD-10-CM

## 2017-08-26 MED ORDER — AMPHETAMINE-DEXTROAMPHET ER 30 MG PO CP24: 30.0000 mg | ORAL_CAPSULE | ORAL | 0 refills | Status: DC

## 2017-08-26 MED ORDER — AMPHETAMINE-DEXTROAMPHETAMINE 10 MG PO TABS: ORAL_TABLET | ORAL | 0 refills | Status: DC

## 2017-08-26 MED ORDER — METOPROLOL SUCCINATE ER 100 MG PO TB24: ORAL_TABLET | ORAL | 3 refills | Status: DC

## 2017-08-26 MED ORDER — AMPHETAMINE-DEXTROAMPHET ER 30 MG PO CP24
30.0000 mg | ORAL_CAPSULE | ORAL | 0 refills | Status: DC
Start: 2017-08-26 — End: 2017-12-01

## 2017-08-26 MED ORDER — ALPRAZOLAM 0.5 MG PO TABS: ORAL_TABLET | ORAL | 1 refills | Status: DC

## 2017-08-26 NOTE — Patient Instructions (Signed)
It was good to see you today- it seems like you are really thriving!   I will be in touch with your labs and will also do your refills today Your irregular menses are unlikely to be of concern, but I will check your thyroid today, and plan to follow-up with your GYN provider

## 2017-08-27 ENCOUNTER — Encounter: Payer: Self-pay | Admitting: Family Medicine

## 2017-08-27 LAB — LIPID PANEL
CHOLESTEROL: 172 mg/dL (ref 0–200)
HDL: 61 mg/dL (ref >39.00)
LDL Cholesterol: 91 mg/dL (ref 0–99)
NONHDL: 111.05
TRIGLYCERIDES: 98 mg/dL (ref 0.0–149.0)
Total CHOL/HDL Ratio: 3
VLDL: 19.6 mg/dL (ref 0.0–40.0)

## 2017-08-27 LAB — CBC
HCT: 35.9 % — ABNORMAL LOW (ref 36.0–46.0)
Hemoglobin: 12.2 g/dL (ref 12.0–15.0)
MCHC: 34 g/dL (ref 30.0–36.0)
MCV: 95.8 fl (ref 78.0–100.0)
Platelets: 417 10*3/uL — ABNORMAL HIGH (ref 150.0–400.0)
RBC: 3.74 Mil/uL — AB (ref 3.87–5.11)
RDW: 13.1 % (ref 11.5–15.5)
WBC: 8.8 10*3/uL (ref 4.0–10.5)

## 2017-08-27 LAB — COMPREHENSIVE METABOLIC PANEL
ALT: 11 U/L (ref 0–35)
AST: 10 U/L (ref 0–37)
Albumin: 4.7 g/dL (ref 3.5–5.2)
Alkaline Phosphatase: 39 U/L (ref 39–117)
BILIRUBIN TOTAL: 0.2 mg/dL (ref 0.2–1.2)
BUN: 13 mg/dL (ref 6–23)
CALCIUM: 10 mg/dL (ref 8.4–10.5)
CO2: 30 meq/L (ref 19–32)
CREATININE: 0.81 mg/dL (ref 0.40–1.20)
Chloride: 104 mEq/L (ref 96–112)
GFR: 85.33 mL/min (ref >60.00)
Glucose, Bld: 91 mg/dL (ref 70–99)
Potassium: 4.6 mEq/L (ref 3.5–5.1)
Sodium: 140 mEq/L (ref 135–145)
Total Protein: 6.8 g/dL (ref 6.0–8.3)

## 2017-08-27 LAB — HEMOGLOBIN A1C: Hgb A1c MFr Bld: 5.5 % (ref 4.6–6.5)

## 2017-08-27 LAB — TSH: TSH: 2.18 u[IU]/mL (ref 0.35–4.50)

## 2017-08-30 LAB — PAIN MGMT, PROFILE 8 W/CONF, U
6 Acetylmorphine: NEGATIVE ng/mL (ref <10)
Alcohol Metabolites: NEGATIVE ng/mL (ref <500)
Alphahydroxyalprazolam: 183 ng/mL — ABNORMAL HIGH (ref <25)
Alphahydroxymidazolam: NEGATIVE ng/mL (ref <50)
Alphahydroxytriazolam: NEGATIVE ng/mL (ref <50)
Aminoclonazepam: NEGATIVE ng/mL (ref <25)
Amphetamine: 2542 ng/mL — ABNORMAL HIGH (ref <250)
Amphetamines: POSITIVE ng/mL — AB (ref <500)
BUPRENORPHINE, URINE: NEGATIVE ng/mL (ref <5)
Benzodiazepines: POSITIVE ng/mL — AB (ref <100)
Cocaine Metabolite: NEGATIVE ng/mL (ref <150)
Creatinine: 31.7 mg/dL
HYDROXYETHYLFLURAZEPAM: NEGATIVE ng/mL (ref <50)
Lorazepam: NEGATIVE ng/mL (ref <50)
MDMA: NEGATIVE ng/mL (ref <500)
METHAMPHETAMINE: NEGATIVE ng/mL (ref <250)
Marijuana Metabolite: NEGATIVE ng/mL (ref <20)
Nordiazepam: NEGATIVE ng/mL (ref <50)
Opiates: NEGATIVE ng/mL (ref <100)
Oxazepam: NEGATIVE ng/mL (ref <50)
Oxidant: NEGATIVE ug/mL (ref <200)
Oxycodone: NEGATIVE ng/mL (ref <100)
Temazepam: NEGATIVE ng/mL (ref <50)
pH: 6.82 (ref 4.5–9.0)

## 2017-11-03 ENCOUNTER — Other Ambulatory Visit: Payer: Self-pay | Admitting: Family Medicine

## 2017-11-03 DIAGNOSIS — F411 Generalized anxiety disorder: Secondary | ICD-10-CM

## 2017-11-03 NOTE — Telephone Encounter (Signed)
Reviewed NCCSR- ok to refill today  

## 2017-11-30 ENCOUNTER — Other Ambulatory Visit: Payer: Self-pay | Admitting: Family Medicine

## 2017-11-30 DIAGNOSIS — F900 Attention-deficit hyperactivity disorder, predominantly inattentive type: Secondary | ICD-10-CM

## 2017-11-30 NOTE — Telephone Encounter (Signed)
Copied from CRM [phone]. Topic: Quick Communication - Rx Refill/Question >> Nov 30, 2017  3:40 PM Floria RavelingStovall, Shana A wrote: Medication: amphetamine-dextroamphetamine (ADDERALL XR) 30 MG 24 hr capsule [[id number]][[id number]] amphetamine-dextroamphetamine (ADDERALL) 10 MG tablet [[id number]][[id number]]   Has the patient contacted their pharmacy? Yes.   (Agent: If no, request that the patient contact the pharmacy for the refill.) (Agent: If yes, when and what did the pharmacy advise?)  Preferred Pharmacy (with phone number or street name): [id number] [place] - [address] AT [facility] [address] [phone] (Phone)   Agent: Please be advised that RX refills may take up to 3 business days. We ask that you follow-up with your pharmacy.

## 2017-12-01 MED ORDER — AMPHETAMINE-DEXTROAMPHETAMINE 10 MG PO TABS: ORAL_TABLET | ORAL | 0 refills | Status: DC

## 2017-12-01 MED ORDER — AMPHETAMINE-DEXTROAMPHET ER 30 MG PO CP24: 30.0000 mg | ORAL_CAPSULE | ORAL | 0 refills | Status: DC

## 2017-12-01 NOTE — Telephone Encounter (Signed)
Pt seen in the office in August   Reviewed NCCSR- all ok Refilled for her today

## 2017-12-01 NOTE — Telephone Encounter (Signed)
Patient calling to check status of these refills. Please advise.

## 2017-12-29 ENCOUNTER — Encounter: Payer: Self-pay | Admitting: Family Medicine

## 2017-12-29 ENCOUNTER — Other Ambulatory Visit: Payer: Self-pay | Admitting: Family Medicine

## 2017-12-29 DIAGNOSIS — F900 Attention-deficit hyperactivity disorder, predominantly inattentive type: Secondary | ICD-10-CM

## 2017-12-29 MED ORDER — AMPHETAMINE-DEXTROAMPHETAMINE 10 MG PO TABS: ORAL_TABLET | ORAL | 0 refills | Status: DC

## 2017-12-29 MED ORDER — AMPHETAMINE-DEXTROAMPHET ER 30 MG PO CP24: 30.0000 mg | ORAL_CAPSULE | ORAL | 0 refills | Status: DC

## 2017-12-29 NOTE — Telephone Encounter (Signed)
Last seen in August NCCSR:  12/01/2017  5   12/01/2017  Adderall Xr 30 Mg Capsule  30.00 30 Je Cop  [id number]  Wal (1228)  0/0  Comm Ins  [place]  12/01/2017  5   12/01/2017  Dextroamp-Amphetamin 10 Mg Tab  30.00 30 Je Cop  [id number]  Wal (1228)  0/0  Comm Ins  [place]  11/29/2017  5   11/03/2017  Alprazolam 0.5 Mg Tablet  80.00 27 Je Cop  [id number]  Wal (4341)  1/2 2.96 LME Comm Ins  [place]  11/03/2017  5   08/26/2017  Adderall Xr 30 Mg Capsule  30.00 30 Je Cop  [id number]  Wal (4341)  0/0  Comm Ins  [place]  11/03/2017  5   11/03/2017  Alprazolam 0.5 Mg Tablet  80.00 27 Je Cop  [id number]  Wal (4341)  0/2 2.96 LME Comm Ins  [place]  11/01/2017  5   08/26/2017  Dextroamp-Amphetamin 10 Mg Tab  30.00 30 Je Cop  [id number]  Wal (4341)  0/0  Comm Ins  [place]  10/05/2017  5   08/26/2017  Alprazolam 0.5 Mg Tablet  80.00 27 Je Cop  [id number]  Wal (4341)  1/1 2.96 LME Comm Ins  [place]  10/05/2017  5   08/26/2017  Adderall Xr 30 Mg Capsule  30.00 30 Je Cop  [id number]  Wal (4341)  0/0  Comm Ins  [place]  10/02/2017  5   08/26/2017  Dextroamp-Amphetamin 10 Mg Tab  30.00 30 Je Cop  [id number]  Wal (4341)  0/0  Comm Ins  [place]  09/07/2017  5   08/26/2017  Alprazolam 0.5 Mg Tablet  80.00 27 Je Cop  [id number]  Wal (4341)  0/1      UDS is UTD

## 2018-01-13 ENCOUNTER — Encounter: Payer: Self-pay | Admitting: Family Medicine

## 2018-01-13 NOTE — Telephone Encounter (Signed)
Pt did not read mychart message reminding her of need for 6 month appt in February.  Called her and [facility] reminding her of need to schedule this appt

## 2018-01-31 ENCOUNTER — Other Ambulatory Visit: Payer: Self-pay | Admitting: Family Medicine

## 2018-01-31 DIAGNOSIS — F411 Generalized anxiety disorder: Secondary | ICD-10-CM

## 2018-01-31 DIAGNOSIS — F900 Attention-deficit hyperactivity disorder, predominantly inattentive type: Secondary | ICD-10-CM

## 2018-01-31 MED ORDER — AMPHETAMINE-DEXTROAMPHETAMINE 10 MG PO TABS: ORAL_TABLET | ORAL | 0 refills | Status: DC

## 2018-01-31 MED ORDER — AMPHETAMINE-DEXTROAMPHET ER 30 MG PO CP24: 30.0000 mg | ORAL_CAPSULE | ORAL | 0 refills | Status: DC

## 2018-01-31 NOTE — Telephone Encounter (Signed)
Seen in August UDS is UTD, august  NCCSR 12/30/2017  5   12/29/2017  Dextroamp-Amphetamin 10 Mg Tab  30.00 30 Je Cop  [id number]  Wal (4341)  0/0  Comm Ins  [place]  12/30/2017  5   12/29/2017  Adderall Xr 30 Mg Capsule  30.00 30 Je Cop  [id number]  Wal (4341)  0/0  Comm Ins  [place]  12/28/2017  5   11/03/2017  Alprazolam 0.5 Mg Tablet  80.00 27 Je Cop  [id number]  Wal (4341)  2/2 2.96 LME Comm Ins  [place]  12/01/2017  5   12/01/2017  Dextroamp-Amphetamin 10 Mg Tab  30.00 30 Je Cop  [id number]  Wal (1228)  0/0  Comm Ins  [place]  12/01/2017  5   12/01/2017  Adderall Xr 30 Mg Capsule  30.00 30 Je Cop  [id number]  Wal (1228)  0/0  Comm Ins  [place]  11/29/2017  5   11/03/2017  Alprazolam 0.5 Mg Tablet  80.00 27 Je Cop  [id number]  Wal (4341)  1/2 2.96 LME Comm Ins  [place]  11/03/2017  5   11/03/2017  Alprazolam 0.5 Mg Tablet  80.00 27 Je Cop  [id number]  Wal (4341)  0/2 2.96 LME Comm Ins  [place]  11/03/2017  5   08/26/2017  Adderall Xr 30 Mg Capsule  30.00 30 Je Cop  [id number]  Wal (4341)  0/0      Ok to refill, she is seeing me next month

## 2018-02-13 NOTE — Progress Notes (Deleted)
[facility] [address], [address] [place], [place] [id number] [phone] [phone]  Date:  02/17/2018   Name:  Danielle CircleLindsay T [name]   DOB:  October 20, 1980   MRN:  [id number]  PCP:  Pearline Cablesopland, Jessica C, MD    Chief Complaint: No chief complaint on file.   History of Present Illness:  Danielle [name] is a 38 y.o. very pleasant female patient who presents with the following:  Patient with history of ADD, panic disorder, tachycardia, lower back pain.  Here today for a periodic visit and medication discussion  Pap? Complete labs done in August I last saw her in August, at which point she was doing well She is living with her girlfriend,  She uses Adderall XR 30 mg in the morning, and 10 mg IR later in the day She is alprazolam at lunch and at bedtime, occasionally takes 1/3 pill depending on her anxiety level  UDS done in August  02/01/2018  5   02/01/2018  Alprazolam 0.5 MG Tablet  80.00 27 Je Cop   [id number]   Wal (4341)   0  2.96 LME  Comm Ins   [place]  01/31/2018  5   01/31/2018  Dextroamp-Amphetamin 10 MG Tab  30.00 30 Je Cop   [id number]   Wal (4341)   0   Comm Ins   [place]  01/31/2018  5   01/31/2018  Adderall Xr 30 MG Capsule  30.00 30 Je Cop   [id number]   Wal (4341)   0   Comm Ins   [place]  12/30/2017  5   12/29/2017  Dextroamp-Amphetamin 10 MG Tab  30.00 30 Je Cop   [id number]   Wal (4341)   0   Comm Ins   [place]  12/30/2017  5   12/29/2017  Adderall Xr 30 MG Capsule  30.00 30 Je Cop   [id number]   Wal (4341)   0   Comm Ins   [place]  12/28/2017  5   11/03/2017  Alprazolam 0.5 MG Tablet  80.00 27 Je Cop   [id number]   Wal (4341)   2  2.96 LME  Comm Ins   [place]  12/01/2017  5   12/01/2017  Adderall Xr 30 MG Capsule  30.00 30 Je Cop   [id number]   Wal (1228)   0   Comm Ins   [place]  12/01/2017  5   12/01/2017  Dextroamp-Amphetamin 10 MG Tab  30.00 30 Je Cop   [id number]   Wal (1228)   0   Comm Ins   [place]  11/29/2017  5   11/03/2017  Alprazolam 0.5 MG Tablet  80.00 27 Je Cop   [id number]   Wal (4341)   1   2.96 LME  Comm Ins   [place]  11/03/2017  5   08/26/2017  Adderall Xr 30 MG Capsule  30.00 30 Je Cop   [id number]   Wal (4341)   0   Comm Ins   [place]  11/03/2017  5   11/03/2017  Alprazolam 0.5 MG Tablet  80.00 27 Je Cop   [id number]   Wal (4341)   0  2.96 LME  Comm Ins   [place]  11/01/2017  5   08/26/2017  Dextroamp-Amphetamin 10 MG Tab  30.00 30 Je Cop   [id number]   Wal (4341)   0   Comm Ins   [place]  10/05/2017  5   08/26/2017  Adderall Xr 30 MG Capsule  30.00 30 Je Cop   [id number]   Wal (4341)   0   Comm Ins   [place]  10/05/2017  5   08/26/2017  Alprazolam 0.5 MG Tablet  80.00 27 Je Cop   [id number]   Wal (4341)   1  2.96 LME  Comm Ins   [place]  10/02/2017  5   08/26/2017  Dextroamp-Amphetamin 10 MG Tab  30.00 30 Je Cop   [id number]   Wal (4341)   0         Patient Active Problem List   Diagnosis Date Noted  . Sinus tachycardia 07/14/2012  . History of panic disorder 07/14/2012  . History of kidney stones 07/14/2012  . DDD (degenerative disc disease), lumbar 07/14/2012  . History of ovarian cyst 07/14/2012  . ADHD (attention deficit hyperactivity disorder) 07/12/2012    Past Medical History:  Diagnosis Date  . Allergy   . Anxiety   . Gall stones   . Kidney stones     Past Surgical History:  Procedure Laterality Date  . WISDOM TOOTH EXTRACTION      Social History   Tobacco Use  . Smoking status: Former Smoker    Packs/day: 0.50    Types: Cigarettes  . Smokeless tobacco: Never Used  Substance Use Topics  . Alcohol use: Yes  . Drug use: No    Family History  Problem Relation Age of Onset  . Hypertension Mother   . Hyperlipidemia Mother   . Heart disease Mother   . Hyperlipidemia Father   . Hyperlipidemia Brother   . Hyperlipidemia Brother     Allergies  Allergen Reactions  . Beef Extract Anaphylaxis  . Amoxicillin Rash  . Penicillins Rash    Medication list has been reviewed and updated.  Current Outpatient Medications on File Prior to Visit  Medication Sig Dispense Refill  . Albuterol Sulfate  (PROAIR RESPICLICK) 108 (90 Base) MCG/ACT AEPB Inhale 2 puffs into the lungs 3 (three) times daily as needed. 1 each 3  . ALPRAZolam (XANAX) 0.5 MG tablet TAKE 1 TABLET BY MOUTH DAILY AT MIDDAY AND AT BEDTIME. MAY TAKE 1 EXTRA DURING DAY AS NEEDED 80 tablet 0  . amphetamine-dextroamphetamine (ADDERALL XR) 30 MG 24 hr capsule Take 1 capsule (30 mg total) by mouth every morning. Ok to fill 60 days after rx 30 capsule 0  . amphetamine-dextroamphetamine (ADDERALL XR) 30 MG 24 hr capsule Take 1 capsule (30 mg total) by mouth every morning. 30 capsule 0  . amphetamine-dextroamphetamine (ADDERALL XR) 30 MG 24 hr capsule Take 1 capsule (30 mg total) by mouth every morning. 30 capsule 0  . amphetamine-dextroamphetamine (ADDERALL) 10 MG tablet Take 1 in the early afternoon as needed.  Fill 60 days after rx 30 tablet 0  . amphetamine-dextroamphetamine (ADDERALL) 10 MG tablet Take one in the early afternoon as needed.  Fill 30 days after rx 30 tablet 0  . amphetamine-dextroamphetamine (ADDERALL) 10 MG tablet Take 1 in the early afternoon as needed. 30 tablet 0  . meloxicam (MOBIC) 7.5 MG tablet Take 1 tablet (7.5 mg total) by mouth daily. Use as needed for pain 30 tablet 6  . metoprolol succinate (TOPROL-XL) 100 MG 24 hr tablet TAKE 1 TABLET BY MOUTH DAILY TAKE WITH OR IMMEDIATELY FOLLOWING A MEAL 90 tablet 3  . OVER THE COUNTER MEDICATION OTC Mammalian Protein Allergy taking    . traMADol (ULTRAM) 50 MG tablet Take 1 tablet (50 mg total) by mouth  every 8 (eight) hours as needed. For back pain 20 tablet 0   No current facility-administered medications on file prior to visit.     Review of Systems:  As per HPI- otherwise negative.   Physical Examination: There were no vitals filed for this visit. There were no vitals filed for this visit. There is no height or weight on file to calculate BMI. Ideal Body Weight:    GEN: WDWN, NAD, Non-toxic, A & O x 3 HEENT: Atraumatic, Normocephalic. Neck supple. No  masses, No LAD. Ears and Nose: No external deformity. CV: RRR, No M/G/R. No JVD. No thrill. No extra heart sounds. PULM: CTA B, no wheezes, crackles, rhonchi. No retractions. No resp. distress. No accessory muscle use. ABD: S, NT, ND, +BS. No rebound. No HSM. EXTR: No c/c/e NEURO Normal gait.  PSYCH: Normally interactive. Conversant. Not depressed or anxious appearing.  Calm demeanor.    Assessment and Plan: ***  Signed Abbe Amsterdam, MD

## 2018-02-17 ENCOUNTER — Ambulatory Visit: Payer: 59 | Admitting: Family Medicine

## 2018-02-19 NOTE — Progress Notes (Deleted)
[facility] [address] [place], [place] [address] [phone]  Date:  02/21/2018   Name:  Danielle [name]   DOB:  20-Oct-1980   MRN:  [id number]  PCP:  Pearline Cables, MD    Chief Complaint: No chief complaint on file.   History of Present Illness:  Danielle [name] is a 38 y.o. very pleasant female patient who presents with the following:  Here today for medication check and refills.  [name] has history of ADD, sinus tachycardia, panic disorder  She uses Adderall X are 30 mg in the morning, and takes 10 mg regular release in the afternoon as needed. She also uses Xanax up to 3 times daily, metoprolol XL 100 mg daily  Her UDS is up-to-date, done in August 2019 Noted that she still has her dad is her contact person, which she like to update this?  02/01/2018  5   02/01/2018  Alprazolam 0.5 MG Tablet  80.00 27 Je Cop   [id number]   Wal (4341)   0  2.96 LME  Comm Ins   [place]  01/31/2018  5   01/31/2018  Dextroamp-Amphetamin 10 MG Tab  30.00 30 Je Cop   [id number]   Wal (4341)   0   Comm Ins   [place]  01/31/2018  5   01/31/2018  Adderall Xr 30 MG Capsule  30.00 30 Je Cop   [id number]   Wal (4341)   0   Comm Ins   [place]  12/30/2017  5   12/29/2017  Dextroamp-Amphetamin 10 MG Tab  30.00 30 Je Cop   [id number]   Wal (4341)   0   Comm Ins   [place]  12/30/2017  5   12/29/2017  Adderall Xr 30 MG Capsule  30.00 30 Je Cop   [id number]   Wal (4341)   0   Comm Ins   [place]  12/28/2017  5   11/03/2017  Alprazolam 0.5 MG Tablet  80.00 27 Je Cop   [id number]   Wal (4341)   2  2.96 LME  Comm Ins   [place]  12/01/2017  5   12/01/2017  Adderall Xr 30 MG Capsule  30.00 30 Je Cop   [id number]   Wal (1228)   0   Comm Ins   [place]  12/01/2017  5   12/01/2017  Dextroamp-Amphetamin 10 MG Tab  30.00 30 Je Cop   [id number]   Wal (1228)   0   Comm Ins   [place]  11/29/2017  5   11/03/2017  Alprazolam 0.5 MG Tablet  80.00 27 Je Cop   [id number]   Wal (4341)   1  2.96 LME  Comm Ins   [place]  11/03/2017  5   08/26/2017   Adderall Xr 30 MG Capsule  30.00 30 Je Cop   [id number]   Wal (4341)   0   Comm Ins   [place]  11/03/2017  5   11/03/2017  Alprazolam 0.5 MG Tablet  80.00 27 Je Cop   [id number]   Wal (4341)   0  2.96 LME  Comm Ins     11/01/2017  5   08/26/2017  Dextroamp-Amphetamin 10 MG Tab  30.00 30 Je Cop   [id number]   Wal (4341)   0         Patient Active Problem List   Diagnosis Date Noted  . Sinus tachycardia 07/14/2012  .  History of panic disorder 07/14/2012  . History of kidney stones 07/14/2012  . DDD (degenerative disc disease), lumbar 07/14/2012  . History of ovarian cyst 07/14/2012  . ADHD (attention deficit hyperactivity disorder) 07/12/2012    Past Medical History:  Diagnosis Date  . Allergy   . Anxiety   . Gall stones   . Kidney stones     Past Surgical History:  Procedure Laterality Date  . WISDOM TOOTH EXTRACTION      Social History   Tobacco Use  . Smoking status: Former Smoker    Packs/day: 0.50    Types: Cigarettes  . Smokeless tobacco: Never Used  Substance Use Topics  . Alcohol use: Yes  . Drug use: No    Family History  Problem Relation Age of Onset  . Hypertension Mother   . Hyperlipidemia Mother   . Heart disease Mother   . Hyperlipidemia Father   . Hyperlipidemia Brother   . Hyperlipidemia Brother     Allergies  Allergen Reactions  . Beef Extract Anaphylaxis  . Amoxicillin Rash  . Penicillins Rash    Medication list has been reviewed and updated.  Current Outpatient Medications on File Prior to Visit  Medication Sig Dispense Refill  . Albuterol Sulfate (PROAIR RESPICLICK) 108 (90 Base) MCG/ACT AEPB Inhale 2 puffs into the lungs 3 (three) times daily as needed. 1 each 3  . ALPRAZolam (XANAX) 0.5 MG tablet TAKE 1 TABLET BY MOUTH DAILY AT MIDDAY AND AT BEDTIME. MAY TAKE 1 EXTRA DURING DAY AS NEEDED 80 tablet 0  . amphetamine-dextroamphetamine (ADDERALL XR) 30 MG 24 hr capsule Take 1 capsule (30 mg total) by mouth every morning. Ok to fill 60 days after rx 30  capsule 0  . amphetamine-dextroamphetamine (ADDERALL XR) 30 MG 24 hr capsule Take 1 capsule (30 mg total) by mouth every morning. 30 capsule 0  . amphetamine-dextroamphetamine (ADDERALL XR) 30 MG 24 hr capsule Take 1 capsule (30 mg total) by mouth every morning. 30 capsule 0  . amphetamine-dextroamphetamine (ADDERALL) 10 MG tablet Take 1 in the early afternoon as needed.  Fill 60 days after rx 30 tablet 0  . amphetamine-dextroamphetamine (ADDERALL) 10 MG tablet Take one in the early afternoon as needed.  Fill 30 days after rx 30 tablet 0  . amphetamine-dextroamphetamine (ADDERALL) 10 MG tablet Take 1 in the early afternoon as needed. 30 tablet 0  . meloxicam (MOBIC) 7.5 MG tablet Take 1 tablet (7.5 mg total) by mouth daily. Use as needed for pain 30 tablet 6  . metoprolol succinate (TOPROL-XL) 100 MG 24 hr tablet TAKE 1 TABLET BY MOUTH DAILY TAKE WITH OR IMMEDIATELY FOLLOWING A MEAL 90 tablet 3  . OVER THE COUNTER MEDICATION OTC Mammalian Protein Allergy taking    . traMADol (ULTRAM) 50 MG tablet Take 1 tablet (50 mg total) by mouth every 8 (eight) hours as needed. For back pain 20 tablet 0   No current facility-administered medications on file prior to visit.     Review of Systems:  As per HPI- otherwise negative.   Physical Examination: There were no vitals filed for this visit. There were no vitals filed for this visit. There is no height or weight on file to calculate BMI. Ideal Body Weight:    GEN: WDWN, NAD, Non-toxic, A & O x 3 HEENT: Atraumatic, Normocephalic. Neck supple. No masses, No LAD. Ears and Nose: No external deformity. CV: RRR, No M/G/R. No JVD. No thrill. No extra heart sounds. PULM: CTA B, no wheezes,  crackles, rhonchi. No retractions. No resp. distress. No accessory muscle use. ABD: S, NT, ND, +BS. No rebound. No HSM. EXTR: No c/c/e NEURO Normal gait.  PSYCH: Normally interactive. Conversant. Not depressed or anxious appearing.  Calm demeanor.    Assessment  and Plan: ***  Signed Abbe Amsterdam, MD

## 2018-02-21 ENCOUNTER — Ambulatory Visit: Payer: 59 | Admitting: Family Medicine

## 2018-02-27 NOTE — Progress Notes (Signed)
[facility] at [facility] [address], [address] [place], [place] [address] [phone]  Date:  02/28/2018   Name:  Danielle [name]   DOB:  1980/12/02   MRN:  [id number]  PCP:  Pearline Cables, MD    Chief Complaint: ADHD (MED REFILLS)   History of Present Illness:  Danielle [name] is a 38 y.o. very pleasant female patient who presents with the following:  Following up on her medications today History of tachycardia, ADHD, panic disorder She is treated with alprazolam and Adderall.  She takes Adderall XR in the morning, and sometimes takes 10 mg of regular release later in the day  She is about due for a pap; she will get this done at her convenience  She declines have a Pap here Her meds are working well, no concerns Her allergist tested her recently- she may be able to eat pork and beef again, her allergic markers have decreased They plan to do a food challenge soon  02/01/2018  5   02/01/2018  Alprazolam 0.5 MG Tablet  80.00 27 Je Cop   [id number]   Wal (4341)   0  2.96 LME  Comm Ins   [place]  01/31/2018  5   01/31/2018  Adderall Xr 30 MG Capsule  30.00 30 Je Cop   [id number]   Wal (4341)   0   Comm Ins   [place]  01/31/2018  5   01/31/2018  Dextroamp-Amphetamin 10 MG Tab  30.00 30 Je Cop   [id number]   Wal (4341)   0   Comm Ins   [place]  12/30/2017  5   12/29/2017  Dextroamp-Amphetamin 10 MG Tab  30.00 30 Je Cop   [id number]   Wal (4341)   0   Comm Ins   [place]  12/30/2017  5   12/29/2017  Adderall Xr 30 MG Capsule  30.00 30 Je Cop   [id number]   Wal (4341)   0   Comm Ins   [place]  12/28/2017  5   11/03/2017  Alprazolam 0.5 MG Tablet  80.00 27 Je Cop   [id number]   Wal (4341)   2  2.96 LME  Comm Ins   [place]  12/01/2017  5   12/01/2017  Adderall Xr 30 MG Capsule  30.00 30 Je Cop   [id number]   Wal (1228)   0   Comm Ins   [place]  12/01/2017  5   12/01/2017  Dextroamp-Amphetamin 10 MG Tab  30.00 30 Je Cop   [id number]   Wal (1228)   0   Comm Ins   [place]  11/29/2017  5   11/03/2017  Alprazolam 0.5 MG  Tablet  80.00 27 Je Cop   [id number]   Wal (4341)   1  2.96 LME  Comm Ins   [place]  11/03/2017  5   08/26/2017  Adderall Xr 30 MG Capsule  30.00 30 Je Cop   [id number]   Wal (4341)   0   Comm Ins   [place]  11/03/2017  5   11/03/2017  Alprazolam 0.5 MG Tablet  80.00 27 Je Cop   [id number]   Wal (4341)   0  2.96 LME  Comm Ins   [place]  11/01/2017  5   08/26/2017  Dextroamp-Amphetamin 10 MG Tab  30.00 30 Je Cop   [id number]   Wal (4341)   0   Comm Ins   [place]  10/05/2017  5   08/26/2017  Adderall Xr 30 MG Capsule  30.00 30 Je Cop   [id number]   Wal (4341)   0       Her UDS is up-to-date, it was done in August I last saw her in August 2019, we had to reschedule couple of visits recently due to bad weather and my child being sick  For the holidays she and her GF visited with their families  She is still enjoying her work  Patient Active Problem List   Diagnosis Date Noted  . Sinus tachycardia 07/14/2012  . History of panic disorder 07/14/2012  . History of kidney stones 07/14/2012  . DDD (degenerative disc disease), lumbar 07/14/2012  . History of ovarian cyst 07/14/2012  . ADHD (attention deficit hyperactivity disorder) 07/12/2012    Past Medical History:  Diagnosis Date  . Allergy   . Anxiety   . Gall stones   . Kidney stones     Past Surgical History:  Procedure Laterality Date  . WISDOM TOOTH EXTRACTION      Social History   Tobacco Use  . Smoking status: Former Smoker    Packs/day: 0.50    Types: Cigarettes  . Smokeless tobacco: Never Used  Substance Use Topics  . Alcohol use: Yes  . Drug use: No    Family History  Problem Relation Age of Onset  . Hypertension Mother   . Hyperlipidemia Mother   . Heart disease Mother   . Hyperlipidemia Father   . Hyperlipidemia Brother   . Hyperlipidemia Brother     Allergies  Allergen Reactions  . Beef Extract Anaphylaxis  . Pork Allergy Anaphylaxis  . Amoxicillin Rash  . Penicillins Rash    Medication list has been reviewed and updated.  Current  Outpatient Medications on File Prior to Visit  Medication Sig Dispense Refill  . Albuterol Sulfate (PROAIR RESPICLICK) 108 (90 Base) MCG/ACT AEPB Inhale 2 puffs into the lungs 3 (three) times daily as needed. 1 each 3  . amphetamine-dextroamphetamine (ADDERALL) 10 MG tablet Take 1 in the early afternoon as needed. 30 tablet 0  . metoprolol succinate (TOPROL-XL) 100 MG 24 hr tablet TAKE 1 TABLET BY MOUTH DAILY TAKE WITH OR IMMEDIATELY FOLLOWING A MEAL 90 tablet 3  . OVER THE COUNTER MEDICATION OTC Mammalian Protein Allergy taking     No current facility-administered medications on file prior to visit.     Review of Systems:  As per HPI- otherwise negative.   Physical Examination: Vitals:   02/28/18 1625  BP: 114/80  Pulse: 83  Resp: 16  Temp: 97.8 F (36.6 C)  SpO2: 98%   Vitals:   02/28/18 1625  Weight: 126 lb (57.2 kg)  Height: 5\' 3"  (1.6 m)   Body mass index is 22.32 kg/m. Ideal Body Weight: Weight in (lb) to have BMI = 25: 140.8  GEN: WDWN, NAD, Non-toxic, A & O x 3, normal weight, fit build.  Appears well HEENT: Atraumatic, Normocephalic. Neck supple. No masses, No LAD. Ears and Nose: No external deformity. CV: RRR, No M/G/R. No JVD. No thrill. No extra heart sounds. PULM: CTA B, no wheezes, crackles, rhonchi. No retractions. No resp. distress. No accessory muscle use. EXTR: No c/c/e NEURO Normal gait.  PSYCH: Normally interactive. Conversant. Not depressed or anxious appearing.  Calm demeanor.    Assessment and Plan: Sinus tachycardia  GAD (generalized anxiety disorder) - Plan: ALPRAZolam (XANAX) 0.5 MG tablet  Attention deficit hyperactivity disorder (ADHD), predominantly inattentive type -  Plan: amphetamine-dextroamphetamine (ADDERALL XR) 30 MG 24 hr capsule, amphetamine-dextroamphetamine (ADDERALL XR) 30 MG 24 hr capsule, amphetamine-dextroamphetamine (ADDERALL) 10 MG tablet, amphetamine-dextroamphetamine (ADDERALL) 10 MG tablet, amphetamine-dextroamphetamine  (ADDERALL XR) 30 MG 24 hr capsule  Danielle [name] is following up today Her tachycardia is controlled with metoprolol She is a Xanax 0.5, twice daily.  She does occasionally take a third dose per day I also refilled her Adderall X are 30, and IR 10  I have asked her to see me in about 6 months  Signed Abbe Amsterdam, MD

## 2018-02-28 ENCOUNTER — Ambulatory Visit: Payer: 59 | Admitting: Family Medicine

## 2018-02-28 ENCOUNTER — Encounter: Payer: Self-pay | Admitting: Family Medicine

## 2018-02-28 VITALS — BP 114/80 | HR 83 | Temp 97.8°F | Resp 16 | Ht 63.0 in | Wt 126.0 lb

## 2018-02-28 DIAGNOSIS — R000 Tachycardia, unspecified: Secondary | ICD-10-CM

## 2018-02-28 DIAGNOSIS — F411 Generalized anxiety disorder: Secondary | ICD-10-CM

## 2018-02-28 DIAGNOSIS — F900 Attention-deficit hyperactivity disorder, predominantly inattentive type: Secondary | ICD-10-CM

## 2018-02-28 MED ORDER — AMPHETAMINE-DEXTROAMPHETAMINE 10 MG PO TABS: ORAL_TABLET | ORAL | 0 refills | Status: DC

## 2018-02-28 MED ORDER — ALPRAZOLAM 0.5 MG PO TABS: ORAL_TABLET | ORAL | 2 refills | Status: DC

## 2018-02-28 MED ORDER — AMPHETAMINE-DEXTROAMPHET ER 30 MG PO CP24: 30.0000 mg | ORAL_CAPSULE | ORAL | 0 refills | Status: DC

## 2018-02-28 NOTE — Patient Instructions (Signed)
Good to see you today- please see me in about 6 month for your next visit  Take care

## 2018-03-29 ENCOUNTER — Other Ambulatory Visit: Payer: Self-pay | Admitting: Family Medicine

## 2018-03-29 ENCOUNTER — Encounter: Payer: Self-pay | Admitting: Family Medicine

## 2018-03-29 DIAGNOSIS — F900 Attention-deficit hyperactivity disorder, predominantly inattentive type: Secondary | ICD-10-CM

## 2018-03-29 NOTE — Telephone Encounter (Signed)
Visit it UTD- seen in February  UDS- UTD  03/01/2018  5   02/28/2018  Dextroamp-Amphetamin 10 MG Tab  30.00 30 Je Cop   [id number]   Wal (4341)   0   Comm Ins   [place]  03/01/2018  5   02/28/2018  Alprazolam 0.5 MG Tablet  80.00 30 Je Cop   [id number]   Wal (4341)   0  2.67 LME  Comm Ins   [place]  03/01/2018  5   02/28/2018  Adderall Xr 30 MG Capsule  30.00 30 Je Cop   [id number]   Wal (4341)   0   Comm Ins   [place]  02/01/2018  5   02/01/2018  Alprazolam 0.5 MG Tablet  80.00 27 Je Cop   [id number]   Wal (4341)   0  2.96 LME  Comm Ins   [place]  01/31/2018  5   01/31/2018  Dextroamp-Amphetamin 10 MG Tab  30.00 30 Je Cop   [id number]   Wal (4341)   0   Comm Ins   [place]  01/31/2018  5   01/31/2018  Adderall Xr 30 MG Capsule  30.00 30 Je Cop   [id number]   Wal (4341)   0   Comm Ins   [place]  12/30/2017  5   12/29/2017  Dextroamp-Amphetamin 10 MG Tab  30.00 30 Je Cop   [id number]   Wal (4341)   0   Comm Ins   [place]  12/30/2017  5   12/29/2017  Adderall Xr 30 MG Capsule  30.00 30 Je Cop   [id number]   Wal (4341)   0   Comm Ins   [place]  12/28/2017  5   11/03/2017  Alprazolam 0.5 MG Tablet  80.00 27 Je Cop   [id number]   Wal (4341)   2  2.96 LME  Comm Ins

## 2018-05-25 ENCOUNTER — Other Ambulatory Visit: Payer: Self-pay | Admitting: Family Medicine

## 2018-05-25 DIAGNOSIS — F900 Attention-deficit hyperactivity disorder, predominantly inattentive type: Secondary | ICD-10-CM

## 2018-05-26 MED ORDER — AMPHETAMINE-DEXTROAMPHET ER 30 MG PO CP24: 30.0000 mg | ORAL_CAPSULE | ORAL | 0 refills | Status: DC

## 2018-05-26 MED ORDER — AMPHETAMINE-DEXTROAMPHETAMINE 10 MG PO TABS: ORAL_TABLET | ORAL | 0 refills | Status: DC

## 2018-05-26 NOTE — Telephone Encounter (Signed)
Last visit here in February  04/30/2018  5   02/28/2018  Alprazolam 0.5 MG Tablet  80.00 30 Je Cop   [id number]   Wal (4341)   2  2.67 LME  Comm Ins   [place]  04/30/2018  5   02/28/2018  Adderall Xr 30 MG Capsule  30.00 30 Je Cop   [id number]   Wal (4341)   0   Comm Ins   [place]  04/30/2018  5   12/29/2017  Dextroamp-Amphetamin 10 MG Tab  30.00 30 Je Cop   [id number]   Wal (4341)   0   Comm Ins   [place]  03/31/2018  5   02/28/2018  Adderall Xr 30 MG Capsule  30.00 30 Je Cop   [id number]   Wal (4341)   0   Comm Ins   [place]  03/31/2018  5   02/28/2018  Alprazolam 0.5 MG Tablet  80.00 30 Je Cop   [id number]   Wal (4341)   1  2.67 LME  Comm Ins   [place]  03/31/2018  5   02/28/2018  Dextroamp-Amphetamin 10 MG Tab  30.00 30 Je Cop   [id number]   Wal (4341)   0   Comm Ins   [place]  03/01/2018  5   02/28/2018  Dextroamp-Amphetamin 10 MG Tab  30.00 30 Je Cop   [id number]   Wal (4341)   0   Comm Ins   [place]  03/01/2018  5   02/28/2018  Alprazolam 0.5 MG Tablet  80.00 30 Je Cop   [id number]   Wal (4341)   0  2.67 LME  Comm Ins   [place]  03/01/2018  5   02/28/2018  Adderall Xr 30 MG Capsule  30.00 30 Je Cop   [id number]   Wal (4341)   0   Comm Ins   [place]   Did refills for her today   Meds ordered this encounter  Medications  . amphetamine-dextroamphetamine (ADDERALL XR) 30 MG 24 hr capsule    Sig: Take 1 capsule (30 mg total) by mouth every morning.    Dispense:  30 capsule    Refill:  0  . amphetamine-dextroamphetamine (ADDERALL) 10 MG tablet    Sig: Take 1 in the early afternoon as needed.    Dispense:  30 tablet    Refill:  0    Ok to fill 30 days after rx  . amphetamine-dextroamphetamine (ADDERALL XR) 30 MG 24 hr capsule    Sig: Take 1 capsule (30 mg total) by mouth every morning.    Dispense:  30 capsule    Refill:  0    Okay to fill 30 days after Rx  . amphetamine-dextroamphetamine (ADDERALL XR) 30 MG 24 hr capsule    Sig: Take 1 capsule (30 mg total) by mouth every morning.    Dispense:  30 capsule    Refill:  0    Ok to fill 60 days after rx  .  amphetamine-dextroamphetamine (ADDERALL) 10 MG tablet    Sig: Take 1 in the early afternoon as needed.    Dispense:  30 tablet    Refill:  0  . amphetamine-dextroamphetamine (ADDERALL) 10 MG tablet    Sig: Take one in the early afternoon as needed.    Dispense:  30 tablet    Refill:  0    Ok to fill 60 days after rx

## 2018-05-26 NOTE — Telephone Encounter (Signed)
Requesting:Adderall Contract:05/07/2016 needs CSC  UDS:08/26/2017 Last Visit:02/28/2018 Next Visit:none Last Refill:02/28/2018 multiple refills? I think patient has multiple prescriptions at pharmacy for different months but please advise if not.   Please Advise

## 2018-05-28 ENCOUNTER — Other Ambulatory Visit: Payer: Self-pay | Admitting: Family Medicine

## 2018-05-28 DIAGNOSIS — F411 Generalized anxiety disorder: Secondary | ICD-10-CM

## 2018-05-30 ENCOUNTER — Other Ambulatory Visit: Payer: Self-pay | Admitting: Family Medicine

## 2018-05-30 DIAGNOSIS — R062 Wheezing: Secondary | ICD-10-CM

## 2018-06-28 ENCOUNTER — Other Ambulatory Visit: Payer: Self-pay | Admitting: Family Medicine

## 2018-06-28 DIAGNOSIS — F900 Attention-deficit hyperactivity disorder, predominantly inattentive type: Secondary | ICD-10-CM

## 2018-06-28 MED ORDER — AMPHETAMINE-DEXTROAMPHET ER 30 MG PO CP24: 30.0000 mg | ORAL_CAPSULE | ORAL | 0 refills | Status: DC

## 2018-06-28 MED ORDER — AMPHETAMINE-DEXTROAMPHETAMINE 10 MG PO TABS: ORAL_TABLET | ORAL | 0 refills | Status: DC

## 2018-07-27 ENCOUNTER — Other Ambulatory Visit: Payer: Self-pay | Admitting: Family Medicine

## 2018-07-27 ENCOUNTER — Telehealth: Payer: Self-pay

## 2018-07-27 DIAGNOSIS — F900 Attention-deficit hyperactivity disorder, predominantly inattentive type: Secondary | ICD-10-CM

## 2018-07-27 MED ORDER — AMPHETAMINE-DEXTROAMPHET ER 30 MG PO CP24: 30.0000 mg | ORAL_CAPSULE | ORAL | 0 refills | Status: DC

## 2018-07-27 MED ORDER — AMPHETAMINE-DEXTROAMPHETAMINE 10 MG PO TABS: ORAL_TABLET | ORAL | 0 refills | Status: DC

## 2018-07-27 NOTE — Telephone Encounter (Signed)
PA approved.

## 2018-07-27 NOTE — Telephone Encounter (Signed)
PA initiated via Covermymeds; KEY: [id number]. Awaiting determination.

## 2018-07-27 NOTE — Telephone Encounter (Signed)
PA initiated via Covermymeds; KEY: [id number]. Awaiting determination.

## 2018-07-28 NOTE — Telephone Encounter (Signed)
PA denied. However, per denial letter they do cover brand Adderall XR.

## 2018-08-25 ENCOUNTER — Other Ambulatory Visit: Payer: Self-pay | Admitting: Family Medicine

## 2018-08-25 DIAGNOSIS — R000 Tachycardia, unspecified: Secondary | ICD-10-CM

## 2018-08-25 DIAGNOSIS — F900 Attention-deficit hyperactivity disorder, predominantly inattentive type: Secondary | ICD-10-CM

## 2018-08-25 DIAGNOSIS — F411 Generalized anxiety disorder: Secondary | ICD-10-CM

## 2018-10-23 ENCOUNTER — Other Ambulatory Visit: Payer: Self-pay | Admitting: Family Medicine

## 2018-10-23 DIAGNOSIS — F411 Generalized anxiety disorder: Secondary | ICD-10-CM

## 2018-10-23 DIAGNOSIS — F900 Attention-deficit hyperactivity disorder, predominantly inattentive type: Secondary | ICD-10-CM

## 2018-10-24 ENCOUNTER — Encounter: Payer: Self-pay | Admitting: Family Medicine

## 2018-10-24 MED ORDER — ALPRAZOLAM 0.5 MG PO TABS: ORAL_TABLET | ORAL | 1 refills | Status: DC

## 2018-10-24 MED ORDER — AMPHETAMINE-DEXTROAMPHETAMINE 10 MG PO TABS: ORAL_TABLET | ORAL | 0 refills | Status: DC

## 2018-10-24 MED ORDER — AMPHETAMINE-DEXTROAMPHET ER 30 MG PO CP24: 30.0000 mg | ORAL_CAPSULE | ORAL | 0 refills | Status: DC

## 2018-10-24 NOTE — Telephone Encounter (Signed)
Requesting:Adderall Contract:none, needs csc UDS:08/26/2017 Last Visit:02/28/2018 Next Visit:none scheduled Last Refill:07/27/2018  Please Advise

## 2018-10-24 NOTE — Telephone Encounter (Signed)
Requesting:Alprazolam Contract:05/07/2016 needs updated csc UDS:10/29/2016 Last Visit:02/28/2018 Next Visit:none scheduled Last Refill:08/26/2018  Please Advise

## 2018-11-06 NOTE — Progress Notes (Signed)
[facility] [address], [address] [place], [place] [address] [phone]  Date:  11/09/2018   Name:  Danielle [name]   DOB:  [birth year]   MRN:  [id number]  PCP:  Pearline Cables, MD    Chief Complaint: No chief complaint on file.   History of Present Illness:  Danielle [name] is a 38 y.o. 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 She has pain down the left buttock and into her left leg  No bowel or bladder dysfunction Her left leg can feel numb/ tingly/ sharp pain She does a physically demanding job and has been doing a lot of physical work at home as well but no particular injury as far as she is aware   She had an issue with her L5/S1 disc when she was a teen-never had any surgery She is living in [place] and would like to see ortho there  She also requests an anti-inflammatory and muscle relaxer if possible   10/25/2018  3   10/24/2018  Adderall Xr 30 MG Capsule  30.00  30 Je Cop   [id number]   Wal (4341)   0   Comm Ins   [place]   10/24/2018  3   10/24/2018  Dextroamp-Amphetamin 10 MG Tab  30.00  30 Je Cop   [id number]   Wal (4341)   0   Comm Ins   [place]  10/24/2018  3   10/24/2018  Alprazolam 0.5 MG Tablet  80.00  26 Je Cop   [id number]   Wal (4341)   0  3.08 LME  Comm Ins   [place]  09/26/2018  3   07/27/2018  Adderall Xr 30 MG Capsule  30.00  30 Je Cop   [id number]   Wal (4341)   0   Comm Ins   [place]  09/26/2018  3   05/26/2018  Dextroamp-Amphetamin 10 MG Tab  30.00  30 Je Cop   [id number]   Wal (4341)   0   Comm Ins   [place]  09/26/2018  3   08/26/2018  Alprazolam 0.5 MG Tablet  80.00  30 Je Cop   [id number]   Wal (4341)   1  2.67 LME  Comm Ins   [place]  08/26/2018  3   05/26/2018  Dextroamp-Amphetamin 10 MG Tab  30.00  30 Je Cop   [id number]   Wal (4341)   0   Comm Ins   [place]  08/26/2018  3  05/26/2018  Adderall Xr 30 MG Capsule  30.00  30 Je Cop   [id number]   Wal (4341)   0   Comm Ins   [place]  08/26/2018  3   08/26/2018  Alprazolam 0.5 MG Tablet  80.00  30 Je Cop   [id number]   Wal (4341)   0  2.67 LME  Comm Ins   [place]  07/27/2018  3   05/26/2018  Adderall Xr 30 MG Capsule  30.00  30 Je Cop   [id number]   Wal (4341)   0   Comm Ins   [place]  07/27/2018  3   05/30/2018  Alprazolam 0.5 MG Tablet  80.00  30 Je Cop   [id number]   Wal (4341)   2  2.67 LME  Comm Ins   [place]  07/27/2018  3   07/27/2018  Dextroamp-Amphetamin 10 MG Tab  30.00  30 Je Cop   [id number]   Wal (4341)   0           Patient Active Problem List   Diagnosis Date Noted  . Sinus tachycardia 07/14/2012  . History of panic disorder 07/14/2012  . History of kidney stones 07/14/2012  . DDD (degenerative disc disease), lumbar 07/14/2012  . History of ovarian cyst 07/14/2012  . ADHD (attention deficit hyperactivity disorder) 07/12/2012    Past Medical History:  Diagnosis Date  . Allergy   . Anxiety   . Gall stones   . Kidney stones     Past Surgical History:  Procedure Laterality Date  . WISDOM TOOTH EXTRACTION      Social History   Tobacco Use  . Smoking status: Former Smoker    Packs/day: 0.50    Types:  Cigarettes  . Smokeless tobacco: Never Used  Substance Use Topics  . Alcohol use: Yes  . Drug use: No    Family History  Problem Relation Age of Onset  . Hypertension Mother   . Hyperlipidemia Mother   . Heart disease Mother   . Hyperlipidemia Father   . Hyperlipidemia Brother   . Hyperlipidemia Brother     Allergies  Allergen Reactions  . Beef Extract Anaphylaxis  . Pork Allergy Anaphylaxis  . Amoxicillin Rash  . Penicillins Rash    Medication list has been reviewed and updated.  Current Outpatient Medications on File Prior to Visit  Medication Sig Dispense Refill  . ALPRAZolam (XANAX) 0.5 MG tablet TAKE 1 TABLET BY MOUTH EVERY DAY AT MIDDAY AND 1 AT BEDTIME; MAY TAKE 1 EXTRA DURING THE DAY AS NEEDED 80 tablet 1  . amphetamine-dextroamphetamine (ADDERALL XR) 30 MG 24 hr capsule Take 1 capsule (30 mg total) by mouth every morning. 30 capsule 0  . amphetamine-dextroamphetamine (ADDERALL XR) 30 MG 24 hr capsule Take 1 capsule (30 mg total) by mouth every morning. 30 capsule 0  . amphetamine-dextroamphetamine (ADDERALL XR) 30 MG 24 hr capsule Take 1 capsule (30 mg total) by mouth every morning. 30 capsule 0  . amphetamine-dextroamphetamine (ADDERALL) 10 MG tablet Take 1 in the early afternoon as needed. 30 tablet 0  . amphetamine-dextroamphetamine (ADDERALL) 10 MG tablet Take one in the early afternoon as needed. 30 tablet 0  . amphetamine-dextroamphetamine (ADDERALL) 10 MG tablet Take 1 in the early afternoon as needed. 30 tablet 0  . metoprolol succinate (TOPROL-XL) 100 MG 24 hr tablet TAKE 1 TABLET BY MOUTH EVERY DAY WITH OR IMMEDIATELY FOLLOWING A MEAL 90 tablet 3  . OVER THE COUNTER MEDICATION OTC Mammalian Protein  Allergy taking    . PROAIR RESPICLICK 161 (90 Base) MCG/ACT AEPB INHALE 2 PUFFS INTO THE LUNGS THREE TIMES DAILY AS NEEDED 1 each 3   No current facility-administered medications on file prior to visit.     Review of Systems:  As per HPI- otherwise  negative.   Physical Examination: There were no vitals filed for this visit. There were no vitals filed for this visit. There is no height or weight on file to calculate BMI. Ideal Body Weight:    Patient observed over video.  She looks well, her normal self.  No cough, wheezing, distress is noted  Assessment and Plan: Attention deficit hyperactivity disorder (ADHD), predominantly inattentive type  GAD (generalized anxiety disorder)  Sinus tachycardia  Medication monitoring encounter - Plan: Pain Mgmt, Profile 8 w/Conf, U  Chronic left-sided low back pain with left-sided sciatica - Plan: Ambulatory referral to Orthopedic Surgery, meloxicam (MOBIC) 7.5 MG tablet, cyclobenzaprine (FLEXERIL) 10 MG tablet  Virtual visit today for a couple of concerns.  Reena continues to take chronic Adderall and also alprazolam.  She is doing well with these medications, no concerns. Not due for refill yet today but I can place refills at her pharmacy to be on hold  She is due for a drug screen, I have ordered this for her.  She plans to schedule a lab visit at her earliest convenience  She describes what sounds like left-sided sciatica, this is been persistent for 3 months despite 2 courses of prednisone.  At this point she would like a referral to an orthopedist which I think is reasonable.  Referral placed, prescribed meloxicam and Flexeril to use as needed.  Advised not to combine Flexeril with alprazolam, do not take Flexeril when driving  Signed Lamar Blinks, MD

## 2018-11-09 ENCOUNTER — Encounter: Payer: Self-pay | Admitting: Family Medicine

## 2018-11-09 ENCOUNTER — Ambulatory Visit (INDEPENDENT_AMBULATORY_CARE_PROVIDER_SITE_OTHER): Payer: 59 | Admitting: Family Medicine

## 2018-11-09 DIAGNOSIS — R000 Tachycardia, unspecified: Secondary | ICD-10-CM | POA: Diagnosis not present

## 2018-11-09 DIAGNOSIS — Z5181 Encounter for therapeutic drug level monitoring: Secondary | ICD-10-CM | POA: Diagnosis not present

## 2018-11-09 DIAGNOSIS — G8929 Other chronic pain: Secondary | ICD-10-CM

## 2018-11-09 DIAGNOSIS — F411 Generalized anxiety disorder: Secondary | ICD-10-CM

## 2018-11-09 DIAGNOSIS — F900 Attention-deficit hyperactivity disorder, predominantly inattentive type: Secondary | ICD-10-CM

## 2018-11-09 DIAGNOSIS — M5442 Lumbago with sciatica, left side: Secondary | ICD-10-CM

## 2018-11-09 MED ORDER — AMPHETAMINE-DEXTROAMPHET ER 30 MG PO CP24: 30.0000 mg | ORAL_CAPSULE | ORAL | 0 refills | Status: DC

## 2018-11-09 MED ORDER — AMPHETAMINE-DEXTROAMPHETAMINE 10 MG PO TABS: ORAL_TABLET | ORAL | 0 refills | Status: DC

## 2018-11-09 MED ORDER — CYCLOBENZAPRINE HCL 10 MG PO TABS: 5.0000 mg | ORAL_TABLET | Freq: Two times a day (BID) | ORAL | 0 refills | Status: DC | PRN

## 2018-11-09 MED ORDER — MELOXICAM 7.5 MG PO TABS: 7.5000 mg | ORAL_TABLET | Freq: Every day | ORAL | 0 refills | Status: DC

## 2018-11-15 ENCOUNTER — Encounter: Payer: Self-pay | Admitting: Family Medicine

## 2018-11-23 ENCOUNTER — Other Ambulatory Visit: Payer: Self-pay

## 2018-11-23 ENCOUNTER — Other Ambulatory Visit: Payer: 59

## 2018-11-23 ENCOUNTER — Ambulatory Visit: Payer: 59 | Admitting: Family Medicine

## 2018-11-23 ENCOUNTER — Other Ambulatory Visit: Payer: Self-pay | Admitting: Family Medicine

## 2018-11-23 DIAGNOSIS — F900 Attention-deficit hyperactivity disorder, predominantly inattentive type: Secondary | ICD-10-CM

## 2018-11-23 DIAGNOSIS — Z5181 Encounter for therapeutic drug level monitoring: Secondary | ICD-10-CM

## 2018-11-23 MED ORDER — AMPHETAMINE-DEXTROAMPHET ER 30 MG PO CP24: 30.0000 mg | ORAL_CAPSULE | ORAL | 0 refills | Status: DC

## 2018-11-23 MED ORDER — AMPHETAMINE-DEXTROAMPHETAMINE 10 MG PO TABS: ORAL_TABLET | ORAL | 0 refills | Status: DC

## 2018-11-25 LAB — PAIN MGMT, PROFILE 8 W/CONF, U
6 Acetylmorphine: NEGATIVE ng/mL
Alcohol Metabolites: NEGATIVE ng/mL (ref <500)
Alphahydroxyalprazolam: 70 ng/mL
Alphahydroxymidazolam: NEGATIVE ng/mL
Alphahydroxytriazolam: NEGATIVE ng/mL
Aminoclonazepam: NEGATIVE ng/mL
Amphetamine: 3667 ng/mL
Amphetamines: POSITIVE ng/mL
Benzodiazepines: POSITIVE ng/mL
Buprenorphine, Urine: NEGATIVE ng/mL
Cocaine Metabolite: NEGATIVE ng/mL
Creatinine: 30.9 mg/dL
Hydroxyethylflurazepam: NEGATIVE ng/mL
Lorazepam: NEGATIVE ng/mL
MDMA: NEGATIVE ng/mL
Marijuana Metabolite: NEGATIVE ng/mL
Methamphetamine: NEGATIVE ng/mL
Nordiazepam: NEGATIVE ng/mL
Opiates: NEGATIVE ng/mL
Oxazepam: NEGATIVE ng/mL
Oxidant: NEGATIVE ug/mL
Oxycodone: NEGATIVE ng/mL
Temazepam: NEGATIVE ng/mL
pH: 6.2 (ref 4.5–9.0)

## 2018-11-28 ENCOUNTER — Encounter: Payer: Self-pay | Admitting: Family Medicine

## 2018-12-05 ENCOUNTER — Other Ambulatory Visit: Payer: Self-pay | Admitting: Family Medicine

## 2018-12-05 DIAGNOSIS — G8929 Other chronic pain: Secondary | ICD-10-CM

## 2018-12-22 ENCOUNTER — Other Ambulatory Visit: Payer: Self-pay | Admitting: Family Medicine

## 2018-12-22 DIAGNOSIS — F900 Attention-deficit hyperactivity disorder, predominantly inattentive type: Secondary | ICD-10-CM

## 2018-12-23 MED ORDER — AMPHETAMINE-DEXTROAMPHET ER 30 MG PO CP24: 30.0000 mg | ORAL_CAPSULE | ORAL | 0 refills | Status: DC

## 2018-12-23 MED ORDER — AMPHETAMINE-DEXTROAMPHETAMINE 10 MG PO TABS: ORAL_TABLET | ORAL | 0 refills | Status: DC

## 2019-01-04 ENCOUNTER — Other Ambulatory Visit: Payer: Self-pay | Admitting: Family Medicine

## 2019-01-04 DIAGNOSIS — F411 Generalized anxiety disorder: Secondary | ICD-10-CM

## 2019-01-04 MED ORDER — ALPRAZOLAM 0.5 MG PO TABS: ORAL_TABLET | ORAL | 1 refills | Status: DC

## 2019-01-04 NOTE — Telephone Encounter (Signed)
Medication Refill - Medication: ALPRAZolam (XANAX) 0.5 MG tablet    Has the patient contacted their pharmacy? Yes.   (Agent: If no, request that the patient contact the pharmacy for the refill.) (Agent: If yes, when and what did the pharmacy advise?)  Preferred Pharmacy (with phone number or street name):  [facility], [place] - [address] AT [address]  [place] [address]  Phone: [phone] Fax: [phone]     Agent: Please be advised that RX refills may take up to 3 business days. We ask that you follow-up with your pharmacy.

## 2019-01-04 NOTE — Telephone Encounter (Signed)
Requested medication (s) are due for refill today: yes  Requested medication (s) are on the active medication list: yes  Last refill:  10/24/2018  Future visit scheduled: no  Notes to clinic:refill cannot be delegated    Requested Prescriptions  Pending Prescriptions Disp Refills   ALPRAZolam (XANAX) 0.5 MG tablet 80 tablet 1    Sig: TAKE 1 TABLET BY MOUTH EVERY DAY AT MIDDAY AND 1 AT BEDTIME; MAY TAKE 1 EXTRA DURING THE DAY AS NEEDED      Not Delegated - Psychiatry:  Anxiolytics/Hypnotics Failed - 01/04/2019 11:07 AM      Failed - This refill cannot be delegated      Passed - Urine Drug Screen completed in last 360 days.      Passed - Valid encounter within last 6 months    Recent Outpatient Visits           1 month ago Attention deficit hyperactivity disorder (ADHD), predominantly inattentive type   [facility], MD   10 months ago Sinus tachycardia   [facility], Gay Filler, MD   1 year ago Screening for thyroid disorder   [facility] at [place] [facility], Gay Filler, MD   1 year ago Attention deficit hyperactivity disorder (ADHD), predominantly inattentive type   [facility], Gay Filler, MD   2 years ago Environmental allergies   [facility] at [place], Gay Filler, MD

## 2019-03-09 ENCOUNTER — Telehealth: Payer: Self-pay | Admitting: Family Medicine

## 2019-03-09 DIAGNOSIS — F411 Generalized anxiety disorder: Secondary | ICD-10-CM

## 2019-03-09 NOTE — Telephone Encounter (Signed)
Medication: ALPRAZolam (XANAX) 0.5 MG tablet   Has the patient contacted their pharmacy? Yes.   (If no, request that the patient contact the pharmacy for the refill.) (If yes, when and what did the pharmacy advise?) Pharm stated they tried calling us today & yesterday      Preferred Pharmacy (with phone number or street name): [id number] - [place], [place] - [address] AT [facility] OF [address]    Agent: Please be advised that RX refills may take up to 3 business days. We ask that you follow-up with your pharmacy.

## 2019-03-10 MED ORDER — ALPRAZOLAM 0.5 MG PO TABS: ORAL_TABLET | ORAL | 0 refills | Status: DC

## 2019-03-10 NOTE — Telephone Encounter (Signed)
Requesting:ALPRAZOLAM Contract:05/07/2016 UDS:11/23/2018 Last Visit:11/09/2018   Next Visit: looks like patient has an upcoming appointment with  New family medicine doctor on 03/15/2019   Last Refill: 01/04/2019  Please Advise

## 2019-04-06 ENCOUNTER — Other Ambulatory Visit: Payer: Self-pay | Admitting: Family Medicine

## 2019-04-06 ENCOUNTER — Encounter: Payer: Self-pay | Admitting: Family Medicine

## 2019-04-06 DIAGNOSIS — F411 Generalized anxiety disorder: Secondary | ICD-10-CM

## 2019-04-06 DIAGNOSIS — F900 Attention-deficit hyperactivity disorder, predominantly inattentive type: Secondary | ICD-10-CM

## 2019-04-06 MED ORDER — ALPRAZOLAM 0.5 MG PO TABS: ORAL_TABLET | ORAL | 0 refills | Status: DC

## 2019-04-06 MED ORDER — AMPHETAMINE-DEXTROAMPHET ER 30 MG PO CP24: 30.0000 mg | ORAL_CAPSULE | ORAL | 0 refills | Status: DC

## 2019-04-06 NOTE — Telephone Encounter (Signed)
Requesting: adderall Contract:N/A UDS:11/23/18 Last Visit:11/09/18 Next Visit:N/A Last Refill:12/23/18  Please Advise

## 2019-04-06 NOTE — Telephone Encounter (Signed)
04/01/2019  1   11/09/2018  Dextroamp-Amphetamin 10 MG Tab  30.00  30 Je Cop   [id number]   Wal (4341)   0   Comm Ins   [place]  03/10/2019  1   03/10/2019  Alprazolam 0.5 MG Tablet  80.00  30 Je Cop   [id number]   Wal (4341)   0  2.67 LME  Comm Ins   [place]  03/09/2019  1   03/08/2019  Hydrocodone-Acetamin 5-325 MG  45.00  15 To Car   [id number]   Wal (4341)   0  15.00 MME  Comm Ins   [place]  03/08/2019  1   03/08/2019  Carisoprodol 250 MG Tablet  60.00  20 To Car   [id number]   Wal (4341)   0  0.60 LME  Comm Ins   [place]  02/24/2019  1   12/23/2018  Dextroamp-Amphetamin 10 MG Tab  30.00  30 Je Cop   [id number]   Wal (4341)   0   Comm Ins   [place]  02/24/2019  1   12/23/2018  Dextroamp-Amphet Er 30 MG Cap  30.00  30 Je Cop   [id number]   Wal (4341)   0   Comm Ins   [place]  02/09/2019  1   02/08/2019  Hydrocodone-Acetamin 5-325 MG  45.00  15 To Car   [id number]   Wal (4341)   0  15.00 MME  Comm Ins     02/08/2019  1   02/08/2019  Carisoprodol 250 MG Tablet  60.00  20 To Car   [id number]   Wal (4341)   0  0.60 LME

## 2019-04-18 NOTE — Progress Notes (Signed)
[facility] [address], [address] [place], [place] [address] [phone]  Date:  04/19/2019   Name:  Danielle [name]   DOB:  [birth year]   MRN:  [id number]  PCP:  Pearline Cables, MD    Chief Complaint: No chief complaint on file.   History of Present Illness:  Danielle [name] is a 39 y.o. very pleasant female patient who presents with the following:  Danielle [name] is here today for follow-up visit-virtual Patient location is home, provider location is office Patient identity confirmed with 2 factors, she gives consent for virtual visit today  She has history of ADHD as well as anxiety She also has significant lumbar degenerative disease, is going to undergo a multilevel decompression later this month Besides her back issues she is doing well and is "healthy and happy" They recently got a new deck built at her home They are engaged to be married- plan to get married in hopefully September  Since December she has had epidural injections 3x.  She has been on STD and likely will need LTD Her mobility is quite limited right now Her surgeon does hope that he will be able to improve her situation significantly with surgery Surgery date is 05/05/19 She will be out for 3 months post- op.  We hope that she will be able to get back to her normal activities and work  She is using hydrocodone and soma right now for pain control.  However Danielle [name] is expensive and not covered by her insurance.  She wonders about a cheaper option Doing ok on current adderall and alprazolam   04/06/2019  1   04/06/2019  Adderall Xr 30 MG Capsule  30.00  30 Je Cop   [id number]   Wal (4341)   0   Comm Ins   [place]  04/05/2019  1   04/05/2019  Hydrocodone-Acetamin 5-325 MG  45.00  15 [address] [place]   [id number]   Wal (4341)   0  15.00 MME  Comm Ins   [place]  04/05/2019  1   04/05/2019  Carisoprodol 250 MG Tablet  60.00  20 Ge Tan   [id number]   Wal (4341)   0  0.60 LME  Comm Ins   [place]  04/01/2019   1   11/09/2018  Dextroamp-Amphetamin 10 MG Tab  30.00  30 Je Cop   [id number]   Wal (4341)   0   Comm Ins   [place]  03/10/2019  1   03/10/2019  Alprazolam 0.5 MG Tablet  80.00  30 Je Cop   [id number]   Wal (4341)   0  2.67 LME  Comm Ins   [place]  03/09/2019  1   03/08/2019  Hydrocodone-Acetamin 5-325 MG  45.00  15 To Car   [id number]   Wal (4341)   0  15.00 MME  Comm Ins   [place]  03/08/2019  1   03/08/2019  Carisoprodol 250 MG Tablet  60.00  20 To Car   [id number]   Wal (4341)   0  0.60 LME  Comm Ins   [place]  02/24/2019  1   12/23/2018  Dextroamp-Amphetamin 10 MG Tab  30.00  30 Je Cop   [id number]   Wal (4341)   0   Comm Ins   [place]  02/24/2019  1   12/23/2018  Dextroamp-Amphet Er 30 MG Cap  30.00  30 Je Cop   [id number]  Wal (4341)   0   Comm Ins   [place]  02/09/2019  1   02/08/2019  Hydrocodone-Acetamin 5-325 MG  45.00  15 To Car   [id number]   Wal (4341)   0  15.00 MME  Comm Ins   [place]  02/08/2019  1   02/08/2019  Carisoprodol 250 MG Tablet  60.00  20 To Car   [id number]   Wal (4341)   0  0.60 LME        Patient Active Problem List   Diagnosis Date Noted  . Sinus tachycardia 07/14/2012  . History of panic disorder 07/14/2012  . History of kidney stones 07/14/2012  . DDD (degenerative disc disease), lumbar 07/14/2012  . History of ovarian cyst 07/14/2012  . ADHD (attention deficit hyperactivity disorder) 07/12/2012    Past Medical History:  Diagnosis Date  . Allergy   . Anxiety   . Gall stones   . Kidney stones     Past Surgical History:  Procedure Laterality Date  . WISDOM TOOTH EXTRACTION      Social History   Tobacco Use  . Smoking status: Former Smoker    Packs/day: 0.50    Types: Cigarettes  . Smokeless tobacco: Never Used  Substance Use Topics  . Alcohol use: Yes  . Drug use: No    Family History  Problem Relation Age of Onset  . Hypertension Mother   . Hyperlipidemia Mother   . Heart disease Mother   . Hyperlipidemia Father   . Hyperlipidemia Brother   . Hyperlipidemia Brother     Allergies  Allergen  Reactions  . Beef (Bovine) Protein Anaphylaxis  . Pork Allergy Anaphylaxis  . Amoxicillin Rash  . Penicillins Rash    Medication list has been reviewed and updated.  Current Outpatient Medications on File Prior to Visit  Medication Sig Dispense Refill  . ALPRAZolam (XANAX) 0.5 MG tablet TAKE 1 TABLET BY MOUTH EVERY DAY AT MIDDAY AND 1 AT BEDTIME; MAY TAKE 1 EXTRA DURING THE DAY AS NEEDED 80 tablet 0  . amphetamine-dextroamphetamine (ADDERALL XR) 30 MG 24 hr capsule Take 1 capsule (30 mg total) by mouth every morning. 30 capsule 0  . amphetamine-dextroamphetamine (ADDERALL XR) 30 MG 24 hr capsule Take 1 capsule (30 mg total) by mouth every morning. 30 capsule 0  . amphetamine-dextroamphetamine (ADDERALL XR) 30 MG 24 hr capsule Take 1 capsule (30 mg total) by mouth every morning. 30 capsule 0  . amphetamine-dextroamphetamine (ADDERALL) 10 MG tablet Take one in the early afternoon as needed. 30 tablet 0  . amphetamine-dextroamphetamine (ADDERALL) 10 MG tablet Take 1 in the early afternoon as needed. 30 tablet 0  . amphetamine-dextroamphetamine (ADDERALL) 10 MG tablet Take 1 in the early afternoon as needed. 30 tablet 0  . meloxicam (MOBIC) 7.5 MG tablet TAKE ONE TABLET BY MOUTH DAILY. USE AS NEEDED FOR BACK PAIN 30 tablet 1  . OVER THE COUNTER MEDICATION OTC Mammalian Protein Allergy taking    . PROAIR RESPICLICK 108 (90 Base) MCG/ACT AEPB INHALE 2 PUFFS INTO THE LUNGS THREE TIMES DAILY AS NEEDED 1 each 3   No current facility-administered medications on file prior to visit.    Review of Systems:  As per HPI- otherwise negative.   Physical Examination: There were no vitals filed for this visit. There were no vitals filed for this visit. There is no height or weight on file to calculate BMI. Ideal Body Weight:    Pt observed over video today- she looks well,  no cough or distress is noted    Assessment and Plan: Chronic low back pain, unspecified back pain laterality, unspecified  whether sciatica present - Plan: methocarbamol (ROBAXIN) 500 MG tablet  Tachycardia - Plan: metoprolol succinate (TOPROL-XL) 100 MG 24 hr tablet  GAD (generalized anxiety disorder)  Attention deficit hyperactivity disorder (ADHD), predominantly inattentive type  Following up today Back pain - she is planning on a fairly major operation soon.  Danielle [name] is expensive and not covered- will have her try robaxin instead Refilled toprol She does not need alprazolam or adderall right now but will let me know when RF is needed Wished her the best in her upcoming operation This visit occurred during the SARS-CoV-2 public health emergency.  Safety protocols were in place, including screening questions prior to the visit, additional usage of staff PPE, and extensive cleaning of exam room while observing appropriate contact time as indicated for disinfecting solutions.    Signed Lamar Blinks, MD

## 2019-04-19 ENCOUNTER — Telehealth (INDEPENDENT_AMBULATORY_CARE_PROVIDER_SITE_OTHER): Payer: 59 | Admitting: Family Medicine

## 2019-04-19 ENCOUNTER — Other Ambulatory Visit: Payer: Self-pay

## 2019-04-19 DIAGNOSIS — F900 Attention-deficit hyperactivity disorder, predominantly inattentive type: Secondary | ICD-10-CM | POA: Diagnosis not present

## 2019-04-19 DIAGNOSIS — G8929 Other chronic pain: Secondary | ICD-10-CM

## 2019-04-19 DIAGNOSIS — R000 Tachycardia, unspecified: Secondary | ICD-10-CM

## 2019-04-19 DIAGNOSIS — F411 Generalized anxiety disorder: Secondary | ICD-10-CM | POA: Diagnosis not present

## 2019-04-19 DIAGNOSIS — M545 Low back pain: Secondary | ICD-10-CM | POA: Diagnosis not present

## 2019-04-19 MED ORDER — METHOCARBAMOL 500 MG PO TABS: 500.0000 mg | ORAL_TABLET | Freq: Four times a day (QID) | ORAL | 1 refills | Status: DC | PRN

## 2019-04-19 MED ORDER — METOPROLOL SUCCINATE ER 100 MG PO TB24: ORAL_TABLET | ORAL | 3 refills | Status: DC

## 2019-05-02 ENCOUNTER — Other Ambulatory Visit: Payer: Self-pay | Admitting: Family Medicine

## 2019-05-02 DIAGNOSIS — F900 Attention-deficit hyperactivity disorder, predominantly inattentive type: Secondary | ICD-10-CM

## 2019-05-02 DIAGNOSIS — F411 Generalized anxiety disorder: Secondary | ICD-10-CM

## 2019-05-03 MED ORDER — AMPHETAMINE-DEXTROAMPHET ER 30 MG PO CP24: 30.0000 mg | ORAL_CAPSULE | ORAL | 0 refills | Status: DC

## 2019-05-03 MED ORDER — ALPRAZOLAM 0.5 MG PO TABS: ORAL_TABLET | ORAL | 2 refills | Status: DC

## 2019-05-03 MED ORDER — AMPHETAMINE-DEXTROAMPHETAMINE 10 MG PO TABS: ORAL_TABLET | ORAL | 0 refills | Status: DC

## 2019-05-29 ENCOUNTER — Other Ambulatory Visit: Payer: Self-pay | Admitting: Family Medicine

## 2019-05-29 DIAGNOSIS — F900 Attention-deficit hyperactivity disorder, predominantly inattentive type: Secondary | ICD-10-CM

## 2019-05-29 MED ORDER — AMPHETAMINE-DEXTROAMPHETAMINE 10 MG PO TABS: ORAL_TABLET | ORAL | 0 refills | Status: DC

## 2019-05-29 MED ORDER — AMPHETAMINE-DEXTROAMPHET ER 30 MG PO CP24: 30.0000 mg | ORAL_CAPSULE | ORAL | 0 refills | Status: DC

## 2019-05-29 NOTE — Telephone Encounter (Signed)
Last OV--11/09/2018 No scheduled upcoming Last refill---05/03/19--#30 no refills UDS--11/23/18 CSC--08/26/2017

## 2019-07-02 ENCOUNTER — Other Ambulatory Visit: Payer: Self-pay | Admitting: Family Medicine

## 2019-07-02 DIAGNOSIS — F900 Attention-deficit hyperactivity disorder, predominantly inattentive type: Secondary | ICD-10-CM

## 2019-08-09 ENCOUNTER — Other Ambulatory Visit: Payer: Self-pay

## 2019-08-09 DIAGNOSIS — F411 Generalized anxiety disorder: Secondary | ICD-10-CM

## 2019-08-09 MED ORDER — ALPRAZOLAM 0.5 MG PO TABS: ORAL_TABLET | ORAL | 2 refills | Status: DC

## 2019-08-09 NOTE — Telephone Encounter (Signed)
Received RX request faxed from [facility].   Requesting: Alprazolam Contract: none UDS: 11/23/2018 Last Visit:11/09/2018 Next Visit: none scheduled Last Refill: 05/03/2019 2 refills  Please Advise

## 2019-09-13 ENCOUNTER — Other Ambulatory Visit: Payer: Self-pay | Admitting: Family Medicine

## 2019-09-13 DIAGNOSIS — F411 Generalized anxiety disorder: Secondary | ICD-10-CM

## 2019-09-13 MED ORDER — ALPRAZOLAM 0.5 MG PO TABS: ORAL_TABLET | ORAL | 2 refills | Status: DC

## 2019-09-13 NOTE — Telephone Encounter (Signed)
Medication:amphetamine-dextroamphetamine (ADDERALL XR) 30 MG  amphetamine-dextroamphetamine (ADDERALL) 10 MG   Has the patient contacted their pharmacy? No. (If no, request that the patient contact the pharmacy for the refill.) (If yes, when and what did the pharmacy advise?)  Preferred Pharmacy (with phone number or street name)  [id number] - [place], [place] - [address] AT [facility] OF [address]  [address] [place] [address]  Phone:  [phone] Fax:  [phone]  Agent: Please be advised that RX refills may take up to 3 business days. We ask that you follow-up with your pharmacy.

## 2019-09-13 NOTE — Telephone Encounter (Signed)
Requesting:Adderall Contract: none UDS:11/23/2018 Last Visit: 11/09/2018 Next Visit: none scheduled-needs cpe Last Refill: 05/29/2019  Please Advise

## 2019-09-14 ENCOUNTER — Encounter: Payer: Self-pay | Admitting: Family Medicine

## 2019-09-14 ENCOUNTER — Other Ambulatory Visit: Payer: Self-pay | Admitting: Family Medicine

## 2019-09-14 DIAGNOSIS — F900 Attention-deficit hyperactivity disorder, predominantly inattentive type: Secondary | ICD-10-CM

## 2019-09-14 MED ORDER — AMPHETAMINE-DEXTROAMPHET ER 30 MG PO CP24: 30.0000 mg | ORAL_CAPSULE | ORAL | 0 refills | Status: DC

## 2019-09-14 MED ORDER — AMPHETAMINE-DEXTROAMPHETAMINE 10 MG PO TABS: ORAL_TABLET | ORAL | 0 refills | Status: DC

## 2019-10-02 ENCOUNTER — Other Ambulatory Visit: Payer: Self-pay | Admitting: Family Medicine

## 2019-10-02 ENCOUNTER — Encounter: Payer: Self-pay | Admitting: Family Medicine

## 2019-10-02 DIAGNOSIS — F900 Attention-deficit hyperactivity disorder, predominantly inattentive type: Secondary | ICD-10-CM

## 2019-10-02 MED ORDER — AMPHETAMINE-DEXTROAMPHET ER 30 MG PO CP24: 30.0000 mg | ORAL_CAPSULE | ORAL | 0 refills | Status: DC

## 2019-10-02 MED ORDER — AMPHETAMINE-DEXTROAMPHETAMINE 10 MG PO TABS: ORAL_TABLET | ORAL | 0 refills | Status: DC

## 2019-10-02 NOTE — Telephone Encounter (Signed)
Requesting:Adderall Contract: 04/2016 needs updated csc UDS:11/23/2018 Last Visit:11/09/2018 Next Visit:none scheduled Last Refill: 09/14/2019  Please Advise

## 2019-10-09 NOTE — Progress Notes (Signed)
[facility] [address] [place], [place] [address] [phone]  Date:  10/11/2019   Name:  Danielle [name]   DOB:  02-27-1980   MRN:  [id number]  PCP:  Pearline Cables, MD    Chief Complaint: No chief complaint on file.   History of Present Illness:  Danielle [name] is a 39 y.o. very pleasant female patient who presents with the following:  Pt here today for a periodic visit and med check-virtual visit Patient location is home, provider location is office.  Patient identity confirmed with 2 factors, she gives consent for virtual visit today The pt and myself are present on the call today  History of anxiety/panic disorder, ADD, tachycardia Last seen by myself in APril  Flu vaccine- will do this weekend  COVID-19 vaccine complete-she had moderna - advised that booster dose advice is still pending for this   She just had her back surgery in April, she is doing some PT now- started last month She is getting soma and pain meds through her surgeon She is out of work right now until November but she plans to be out a bit longer as she is still unable to sit for any extended period, she cannot drive  She got an epidural steroid last week but it did not really help unfortunately Her mood has been ok during this stressful period She is doing well on current doses of alprazolam, adderall   10/07/2019  1   08/09/2019  Alprazolam 0.5 MG Tablet  80.00  30 Je Cop   [id number]   Wal (4341)   2/2  2.67 LME  Comm Ins   [place]  10/02/2019  1   10/02/2019  Carisoprodol 350 MG Tablet  60.00  30 [place]   [id number]   Wal (4341)   0/2  0.56 LME  Comm Ins   [place]  10/02/2019  1   10/02/2019  Hydrocodone-Acetamin 5-325 MG  60.00  30 [place]   [id number]   Wal (4341)   0/0  10.00 MME  Comm Ins   [place]  09/14/2019  1   09/14/2019  Dextroamp-Amphetamin 10 MG Tab  30.00  30 Je Cop   [id number]   Wal (4341)   0/0   Comm Ins   [place]  09/14/2019  1   09/14/2019  Adderall Xr  30 MG Capsule  30.00  30 Je Cop   [id number]   Wal (4341)   0/0   Comm Ins   [place]  09/09/2019  1   08/09/2019  Alprazolam 0.5 MG Tablet  80.00  30 Je Cop   [id number]   Wal (4341)   1/2  2.67 LME  Comm Ins   [place]  09/04/2019  1   09/04/2019  Carisoprodol 350 MG Tablet  60.00  30 Ge Tan   [id number]   Wal (4341)   0/0  0.56 LME  Comm Ins   [place]  09/04/2019  1   09/04/2019  Hydrocodone-Acetamin 5-325 MG  40.00  20 Ge Tan   [id number]   Wal (4341)   0/0  10.00 MME  Comm Ins   [place]  08/14/2019  1   05/03/2019  Dextroamp-Amphetamin 10 MG Tab  30.00  30 Je Cop   [id number]   Wal (4341)   0/0   Comm Ins     08/14/2019  1   05/29/2019  Dextroamp-Amphet Er 30 MG Cap  30.00  30 Je Cop   [id number]   Wal (4341)   0/0   Comm Ins   [place]  08/11/2019  1   08/09/2019  Alprazolam 0.5 MG Tablet  80.00  30 Je Cop   [id number]   Wal (4341)   0/2  2.67 LME  Comm Ins   [place]  08/04/2019  1   08/04/2019  Hydrocodone-Acetamin 5-325 MG  60.00  20 To Car   [id number]   Wal (4341)   0/0  15.00 MME  Comm Ins   [place]  08/04/2019  1   08/04/2019  Carisoprodol 250 MG Tablet  60.00  20 To Car   [id number]   Wal (4341)   0/0  0.60 LME  Comm Ins   [place]  07/14/2019  1   05/03/2019  Alprazolam 0.5 MG Tablet  80.00  30 Je Cop   [id number]   Wal (4341)   2/2  2.67 LME  Comm Ins   [place]  07/14/2019  1   07/14/2019  Carisoprodol 250 MG Tablet  60.00  20 To Car   [id number]   Wal (4341)   0/0  0.60 LME        Patient Active Problem List   Diagnosis Date Noted  . Sinus tachycardia 07/14/2012  . History of panic disorder 07/14/2012  . History of kidney stones 07/14/2012  . DDD (degenerative disc disease), lumbar 07/14/2012  . History of ovarian cyst 07/14/2012  . ADHD (attention deficit hyperactivity disorder) 07/12/2012    Past Medical History:  Diagnosis Date  . Allergy   . Anxiety   . Gall stones   . Kidney stones     Past Surgical History:  Procedure Laterality Date  . WISDOM TOOTH EXTRACTION      Social History   Tobacco Use  . Smoking status: Former Smoker    Packs/day:  0.50    Types: Cigarettes  . Smokeless tobacco: Never Used  Substance Use Topics  . Alcohol use: Yes  . Drug use: No    Family History  Problem Relation Age of Onset  . Hypertension Mother   . Hyperlipidemia Mother   . Heart disease Mother   . Hyperlipidemia Father   . Hyperlipidemia Brother   . Hyperlipidemia Brother     Allergies  Allergen Reactions  . Beef (Bovine) Protein Anaphylaxis  . Pork Allergy Anaphylaxis  . Amoxicillin Rash  . Penicillins Rash    Medication list has been reviewed and updated.  Current Outpatient Medications on File Prior to Visit  Medication Sig Dispense Refill  . amphetamine-dextroamphetamine (ADDERALL XR) 30 MG 24 hr capsule Take 1 capsule (30 mg total) by mouth every morning. 30 capsule 0  . amphetamine-dextroamphetamine (ADDERALL XR) 30 MG 24 hr capsule Take 1 capsule (30 mg total) by mouth every morning. 30 capsule 0  . amphetamine-dextroamphetamine (ADDERALL XR) 30 MG 24 hr capsule Take 1 capsule (30 mg total) by mouth every morning. 30 capsule 0  . amphetamine-dextroamphetamine (ADDERALL) 10 MG tablet Take 1 in the early afternoon as needed. 30 tablet 0  . amphetamine-dextroamphetamine (ADDERALL) 10 MG tablet Take 1 in the early afternoon as needed. 30 tablet 0  . amphetamine-dextroamphetamine (ADDERALL) 10 MG tablet Take one in the early afternoon as needed. 30 tablet 0  . meloxicam (MOBIC) 7.5 MG tablet TAKE ONE TABLET BY MOUTH DAILY. USE AS NEEDED FOR BACK PAIN 30 tablet 1  . methocarbamol (ROBAXIN) 500 MG tablet Take 1 tablet (500 mg total) by mouth  every 6 (six) hours as needed for muscle spasms. 120 tablet 1  . metoprolol succinate (TOPROL-XL) 100 MG 24 hr tablet TAKE 1 TABLET BY MOUTH EVERY DAY WITH OR IMMEDIATELY FOLLOWING A MEAL 90 tablet 3  . OVER THE COUNTER MEDICATION OTC Mammalian Protein Allergy taking    . PROAIR RESPICLICK 108 (90 Base) MCG/ACT AEPB INHALE 2 PUFFS INTO THE LUNGS THREE TIMES DAILY AS NEEDED 1 each 3   No  current facility-administered medications on file prior to visit.    Review of Systems:  As per HPI- otherwise negative.   Physical Examination: There were no vitals filed for this visit. There were no vitals filed for this visit. There is no height or weight on file to calculate BMI. Ideal Body Weight:    Pt observed via video monitor.  She looks well, her normal self   Assessment and Plan: GAD (generalized anxiety disorder) - Plan: ALPRAZolam (XANAX) 0.5 MG tablet  Attention deficit hyperactivity disorder (ADHD), predominantly inattentive type - Plan: amphetamine-dextroamphetamine (ADDERALL XR) 30 MG 24 hr capsule, amphetamine-dextroamphetamine (ADDERALL XR) 30 MG 24 hr capsule, amphetamine-dextroamphetamine (ADDERALL) 10 MG tablet, amphetamine-dextroamphetamine (ADDERALL) 10 MG tablet  Virtual visit today to go over meds- video used entire visit  Doing ok on current doses as above Her back pain is being treated by her surgeon Refilled meds as above, encouraged flu shot She will see me in 6 months   Signed Abbe Amsterdam, MD

## 2019-10-11 ENCOUNTER — Other Ambulatory Visit: Payer: Self-pay

## 2019-10-11 ENCOUNTER — Telehealth (INDEPENDENT_AMBULATORY_CARE_PROVIDER_SITE_OTHER): Payer: 59 | Admitting: Family Medicine

## 2019-10-11 DIAGNOSIS — F411 Generalized anxiety disorder: Secondary | ICD-10-CM | POA: Diagnosis not present

## 2019-10-11 DIAGNOSIS — F900 Attention-deficit hyperactivity disorder, predominantly inattentive type: Secondary | ICD-10-CM | POA: Diagnosis not present

## 2019-10-11 MED ORDER — AMPHETAMINE-DEXTROAMPHET ER 30 MG PO CP24: 30.0000 mg | ORAL_CAPSULE | ORAL | 0 refills | Status: DC

## 2019-10-11 MED ORDER — AMPHETAMINE-DEXTROAMPHETAMINE 10 MG PO TABS: ORAL_TABLET | ORAL | 0 refills | Status: DC

## 2019-10-11 MED ORDER — ALPRAZOLAM 0.5 MG PO TABS: ORAL_TABLET | ORAL | 3 refills | Status: DC

## 2020-01-01 ENCOUNTER — Encounter: Payer: Self-pay | Admitting: Family Medicine

## 2020-01-01 DIAGNOSIS — F411 Generalized anxiety disorder: Secondary | ICD-10-CM

## 2020-01-01 DIAGNOSIS — F900 Attention-deficit hyperactivity disorder, predominantly inattentive type: Secondary | ICD-10-CM

## 2020-01-02 MED ORDER — AMPHETAMINE-DEXTROAMPHETAMINE 10 MG PO TABS: ORAL_TABLET | ORAL | 0 refills | Status: DC

## 2020-01-02 MED ORDER — AMPHETAMINE-DEXTROAMPHET ER 30 MG PO CP24: 30.0000 mg | ORAL_CAPSULE | ORAL | 0 refills | Status: DC

## 2020-01-02 MED ORDER — ALPRAZOLAM 0.5 MG PO TABS: ORAL_TABLET | ORAL | 2 refills | Status: DC

## 2020-01-14 MED ORDER — AMPHETAMINE-DEXTROAMPHETAMINE 10 MG PO TABS: ORAL_TABLET | ORAL | 0 refills | Status: DC

## 2020-01-14 MED ORDER — AMPHETAMINE-DEXTROAMPHET ER 30 MG PO CP24
30.0000 mg | ORAL_CAPSULE | ORAL | 0 refills | Status: DC
Start: 2020-01-14 — End: 2020-06-18

## 2020-01-14 MED ORDER — AMPHETAMINE-DEXTROAMPHET ER 30 MG PO CP24: 30.0000 mg | ORAL_CAPSULE | ORAL | 0 refills | Status: DC

## 2020-01-14 MED ORDER — AMPHETAMINE-DEXTROAMPHETAMINE 10 MG PO TABS
ORAL_TABLET | ORAL | 0 refills | Status: DC
Start: 2020-01-14 — End: 2020-06-18

## 2020-01-14 MED ORDER — ALPRAZOLAM 0.5 MG PO TABS
ORAL_TABLET | ORAL | 2 refills | Status: DC
Start: 2020-01-14 — End: 2020-07-20

## 2020-01-14 NOTE — Addendum Note (Signed)
Addended by: Abbe Amsterdam C on: 01/14/2020 01:02 PM   Modules accepted: Orders

## 2020-01-19 ENCOUNTER — Other Ambulatory Visit: Payer: Self-pay | Admitting: Family Medicine

## 2020-01-19 DIAGNOSIS — M545 Low back pain, unspecified: Secondary | ICD-10-CM

## 2020-01-19 DIAGNOSIS — G8929 Other chronic pain: Secondary | ICD-10-CM

## 2020-03-02 NOTE — Progress Notes (Deleted)
[facility] [address], [address] [place], [place] [address] [phone]  Date:  03/04/2020   Name:  Danielle [name]   DOB:  07-08-1980   MRN:  [id number]  PCP:  Pearline Cables, MD    Chief Complaint: No chief complaint on file.   History of Present Illness:  Danielle [name] is a 40 y.o. very pleasant female patient who presents with the following:  Video visit today, patient location is home.  My location is office.  Patient identity confirmed with two factors, she gives consent for virtual visit today The patient myself are present on the call  History of back pain from degenerative disc disease, ADHD, panic disorder She has plans for spine surgery next month per Dr. Herbert Deaner Daubert with [facility] in [place]  Today she notes concern of pain and bruising on her hand  Patient Active Problem List   Diagnosis Date Noted  . Sinus tachycardia 07/14/2012  . History of panic disorder 07/14/2012  . History of kidney stones 07/14/2012  . DDD (degenerative disc disease), lumbar 07/14/2012  . History of ovarian cyst 07/14/2012  . ADHD (attention deficit hyperactivity disorder) 07/12/2012    Past Medical History:  Diagnosis Date  . Allergy   . Anxiety   . Gall stones   . Kidney stones     Past Surgical History:  Procedure Laterality Date  . WISDOM TOOTH EXTRACTION      Social History   Tobacco Use  . Smoking status: Former Smoker    Packs/day: 0.50    Types: Cigarettes  . Smokeless tobacco: Never Used  Substance Use Topics  . Alcohol use: Yes  . Drug use: No    Family History  Problem Relation Age of Onset  . Hypertension Mother   . Hyperlipidemia Mother   . Heart disease Mother   . Hyperlipidemia Father   . Hyperlipidemia Brother   . Hyperlipidemia Brother     Allergies  Allergen Reactions  . Beef (Bovine) Protein Anaphylaxis  . Pork Allergy Anaphylaxis  . Amoxicillin Rash  .  Penicillins Rash    Medication list has been reviewed and updated.  Current Outpatient Medications on File Prior to Visit  Medication Sig Dispense Refill  . ALPRAZolam (XANAX) 0.5 MG tablet TAKE 1 TABLET BY MOUTH DAILY AT MIDDAY, 1 AT BEDTIME AND MAY TAKE 1 EXTRA DURING DAY IF NEEDED 80 tablet 2  . amphetamine-dextroamphetamine (ADDERALL XR) 30 MG 24 hr capsule Take 1 capsule (30 mg total) by mouth every morning. 30 capsule 0  . amphetamine-dextroamphetamine (ADDERALL XR) 30 MG 24 hr capsule Take 1 capsule (30 mg total) by mouth every morning. 30 capsule 0  . amphetamine-dextroamphetamine (ADDERALL XR) 30 MG 24 hr capsule Take 1 capsule (30 mg total) by mouth every morning. 30 capsule 0  . amphetamine-dextroamphetamine (ADDERALL) 10 MG tablet Take one in the early afternoon as needed. 30 tablet 0  . amphetamine-dextroamphetamine (ADDERALL) 10 MG tablet Take 1 in the early afternoon as needed. 30 tablet 0  . amphetamine-dextroamphetamine (ADDERALL) 10 MG tablet Take 1 in the early afternoon as needed. 30 tablet 0  . meloxicam (MOBIC) 7.5 MG tablet TAKE ONE TABLET BY MOUTH DAILY. USE AS NEEDED FOR BACK PAIN 30 tablet 1  . methocarbamol (ROBAXIN) 500 MG tablet Take 1 tablet (500 mg total) by mouth every 6 (six) hours as needed for muscle spasms. 120 tablet 3  . metoprolol succinate (TOPROL-XL)  100 MG 24 hr tablet TAKE 1 TABLET BY MOUTH EVERY DAY WITH OR IMMEDIATELY FOLLOWING A MEAL 90 tablet 3  . OVER THE COUNTER MEDICATION OTC Mammalian Protein Allergy taking    . PROAIR RESPICLICK 108 (90 Base) MCG/ACT AEPB INHALE 2 PUFFS INTO THE LUNGS THREE TIMES DAILY AS NEEDED 1 each 3   No current facility-administered medications on file prior to visit.    Review of Systems:  As per HPI- otherwise negative.   Physical Examination: There were no vitals filed for this visit. There were no vitals filed for this visit. There is no height or weight on file to calculate BMI. Ideal Body Weight:     ***  Assessment and Plan: ***  Signed Abbe Amsterdam, MD

## 2020-03-04 ENCOUNTER — Telehealth: Payer: 59 | Admitting: Family Medicine

## 2020-03-04 ENCOUNTER — Other Ambulatory Visit: Payer: Self-pay

## 2020-03-04 ENCOUNTER — Encounter: Payer: Self-pay | Admitting: Family Medicine

## 2020-03-04 DIAGNOSIS — Z91199 Patient's noncompliance with other medical treatment and regimen due to unspecified reason: Secondary | ICD-10-CM

## 2020-03-04 DIAGNOSIS — Z5329 Procedure and treatment not carried out because of patient's decision for other reasons: Secondary | ICD-10-CM

## 2020-03-04 NOTE — Progress Notes (Signed)
No show

## 2020-03-06 NOTE — Progress Notes (Signed)
[facility] [address], [address] [place], [place] [address] [phone]  Date:  03/07/2020   Name:  Danielle [name]   DOB:  01-Nov-1980   MRN:  [id number]  PCP:  Pearline Cables, MD    Chief Complaint: No chief complaint on file.   History of Present Illness:  Danielle [name] is a 40 y.o. very pleasant female patient who presents with the following:  Virtual visit today to discuss a problem with her hand Patient location is home, provider location is office.  Patient identity confirmed with 2 factors, she gives consent for virtual visit today The patient and myself are present on the call -she gives consent for virtual visit today  Her most recent virtual visit was in September History of anxiety/panic disorder, ADD, tachycardia, significant spine disease  She has a revision of her laminectomy planned early in March per her surgeon at [facility] She is a bit anxious about this but ready to get it done   She is here today with a concern about her right hand- she noted a bump and then developed a bruise on the dorsum of her right hand.  It is a little bit tender but not otherwise bothersome She first noted it a week ago It was there when she awoke in the am-she is not aware of any injury No other unusual bruising noted on her body The area in her hand seems to be improving.  It is difficult for me to see this over video monitor  She is not able to drive right now with her back issues-she is dependent on others for transportation Her father is a family physician and will be with her for her surgery  She has lost a bit of weight- she is not able to exercise as much and has been anxious about her surgeries not eating as much.  She notes that her most recent weight was about 117 pounds  Wt Readings from Last 3 Encounters:  02/28/18 126 lb (57.2 kg)  08/26/17 128 lb (58.1 kg)  02/18/17 120 lb (54.4 kg)    Patient Active  Problem List   Diagnosis Date Noted  . Sinus tachycardia 07/14/2012  . History of panic disorder 07/14/2012  . History of kidney stones 07/14/2012  . DDD (degenerative disc disease), lumbar 07/14/2012  . History of ovarian cyst 07/14/2012  . ADHD (attention deficit hyperactivity disorder) 07/12/2012    Past Medical History:  Diagnosis Date  . Allergy   . Anxiety   . Gall stones   . Kidney stones     Past Surgical History:  Procedure Laterality Date  . WISDOM TOOTH EXTRACTION      Social History   Tobacco Use  . Smoking status: Former Smoker    Packs/day: 0.50    Types: Cigarettes  . Smokeless tobacco: Never Used  Substance Use Topics  . Alcohol use: Yes  . Drug use: No    Family History  Problem Relation Age of Onset  . Hypertension Mother   . Hyperlipidemia Mother   . Heart disease Mother   . Hyperlipidemia Father   . Hyperlipidemia Brother   . Hyperlipidemia Brother     Allergies  Allergen Reactions  . Beef (Bovine) Protein Anaphylaxis  . Pork Allergy Anaphylaxis  . Amoxicillin Rash  . Penicillins Rash    Medication list has been reviewed and updated.  Current Outpatient Medications on File Prior to Visit  Medication Sig Dispense Refill  . ALPRAZolam (XANAX) 0.5 MG tablet TAKE 1 TABLET BY MOUTH DAILY AT MIDDAY, 1 AT BEDTIME AND MAY TAKE 1 EXTRA DURING DAY IF NEEDED 80 tablet 2  . amphetamine-dextroamphetamine (ADDERALL XR) 30 MG 24 hr capsule Take 1 capsule (30 mg total) by mouth every morning. 30 capsule 0  . amphetamine-dextroamphetamine (ADDERALL XR) 30 MG 24 hr capsule Take 1 capsule (30 mg total) by mouth every morning. 30 capsule 0  . amphetamine-dextroamphetamine (ADDERALL XR) 30 MG 24 hr capsule Take 1 capsule (30 mg total) by mouth every morning. 30 capsule 0  . amphetamine-dextroamphetamine (ADDERALL) 10 MG tablet Take one in the early afternoon as needed. 30 tablet 0  . amphetamine-dextroamphetamine (ADDERALL) 10 MG tablet Take 1 in the early  afternoon as needed. 30 tablet 0  . amphetamine-dextroamphetamine (ADDERALL) 10 MG tablet Take 1 in the early afternoon as needed. 30 tablet 0  . meloxicam (MOBIC) 7.5 MG tablet TAKE ONE TABLET BY MOUTH DAILY. USE AS NEEDED FOR BACK PAIN 30 tablet 1  . methocarbamol (ROBAXIN) 500 MG tablet Take 1 tablet (500 mg total) by mouth every 6 (six) hours as needed for muscle spasms. 120 tablet 3  . metoprolol succinate (TOPROL-XL) 100 MG 24 hr tablet TAKE 1 TABLET BY MOUTH EVERY DAY WITH OR IMMEDIATELY FOLLOWING A MEAL 90 tablet 3  . OVER THE COUNTER MEDICATION OTC Mammalian Protein Allergy taking    . PROAIR RESPICLICK 108 (90 Base) MCG/ACT AEPB INHALE 2 PUFFS INTO THE LUNGS THREE TIMES DAILY AS NEEDED 1 each 3   No current facility-administered medications on file prior to visit.    Review of Systems:  As per HPI- otherwise negative.   Physical Examination: There were no vitals filed for this visit. There were no vitals filed for this visit. There is no height or weight on file to calculate BMI. Ideal Body Weight:    Patient looks well.  No distress or shortness of breath is noted.  She seems to have full range of motion function of her right hand.  It is difficult for me to get a good look at her hand over video monitor.  She does show me a photo of her hand from earlier during the course, which appears to show some bruising over the dorsum of the hand  Assessment and Plan: Arm bruise, right, initial encounter  Abnormal EKG  Virtual visit today for concern of a bruise on the back of her right hand.  This seemed to come up spontaneously overnight, is gradually getting better.  I explained to patient that I cannot completely evaluate this issue over virtual format.  We are glad to see her in the office at her convenience.  This is difficult for her right now as she is not able to drive.  We offered to work with her schedule and try to get her seen if she would like.  However, as this issue with  her hand is getting better she may choose to just observe  Also, she notes that recent preoperative evaluation her EKG showed "nonspecific T wave abnormality" There was no apparent concern and she was cleared for surgery.  I advised her that most likely this is normal variation, but certainly I am glad to review her EKG if she would like to send me a copy  Video used for duration of visit today    Signed Abbe Amsterdam, MD

## 2020-03-07 ENCOUNTER — Telehealth (INDEPENDENT_AMBULATORY_CARE_PROVIDER_SITE_OTHER): Payer: 59 | Admitting: Family Medicine

## 2020-03-07 ENCOUNTER — Other Ambulatory Visit: Payer: Self-pay

## 2020-03-07 DIAGNOSIS — R9431 Abnormal electrocardiogram [ECG] [EKG]: Secondary | ICD-10-CM | POA: Diagnosis not present

## 2020-03-07 DIAGNOSIS — S40021A Contusion of right upper arm, initial encounter: Secondary | ICD-10-CM | POA: Diagnosis not present

## 2020-04-02 ENCOUNTER — Other Ambulatory Visit: Payer: Self-pay | Admitting: Family Medicine

## 2020-04-02 DIAGNOSIS — R000 Tachycardia, unspecified: Secondary | ICD-10-CM

## 2020-04-03 ENCOUNTER — Other Ambulatory Visit: Payer: Self-pay

## 2020-06-18 ENCOUNTER — Other Ambulatory Visit: Payer: Self-pay | Admitting: Family Medicine

## 2020-06-18 DIAGNOSIS — F900 Attention-deficit hyperactivity disorder, predominantly inattentive type: Secondary | ICD-10-CM

## 2020-06-18 MED ORDER — AMPHETAMINE-DEXTROAMPHET ER 30 MG PO CP24: 30.0000 mg | ORAL_CAPSULE | ORAL | 0 refills | Status: DC

## 2020-06-18 MED ORDER — AMPHETAMINE-DEXTROAMPHETAMINE 10 MG PO TABS: ORAL_TABLET | ORAL | 0 refills | Status: DC

## 2020-06-18 MED ORDER — AMPHETAMINE-DEXTROAMPHETAMINE 10 MG PO TABS
ORAL_TABLET | ORAL | 0 refills | Status: DC
Start: 2020-06-18 — End: 2021-02-04

## 2020-07-19 ENCOUNTER — Other Ambulatory Visit: Payer: Self-pay | Admitting: Family Medicine

## 2020-07-19 DIAGNOSIS — F411 Generalized anxiety disorder: Secondary | ICD-10-CM

## 2020-07-25 ENCOUNTER — Telehealth: Payer: Self-pay

## 2020-07-25 NOTE — Telephone Encounter (Signed)
Pt called regarding Adderall 10 MG. Pharmacy states they received the 30 MG. I advised that three Rxs for the 10 MG were sent on 06/18/20 and confirmed by the pharmacy. Pt states she will call the pharmacy again. May need to call pharmacy for confirmation.

## 2020-08-22 ENCOUNTER — Other Ambulatory Visit: Payer: Self-pay | Admitting: Family Medicine

## 2020-08-22 ENCOUNTER — Encounter: Payer: Self-pay | Admitting: Family Medicine

## 2020-08-22 DIAGNOSIS — F411 Generalized anxiety disorder: Secondary | ICD-10-CM

## 2020-08-22 DIAGNOSIS — F900 Attention-deficit hyperactivity disorder, predominantly inattentive type: Secondary | ICD-10-CM

## 2020-08-22 MED ORDER — AMPHETAMINE-DEXTROAMPHET ER 30 MG PO CP24: 30.0000 mg | ORAL_CAPSULE | ORAL | 0 refills | Status: DC

## 2020-08-22 MED ORDER — ALPRAZOLAM 0.5 MG PO TABS: ORAL_TABLET | ORAL | 1 refills | Status: DC

## 2020-10-23 ENCOUNTER — Encounter: Payer: Self-pay | Admitting: Family Medicine

## 2020-10-23 ENCOUNTER — Other Ambulatory Visit: Payer: Self-pay | Admitting: Family Medicine

## 2020-10-23 DIAGNOSIS — F411 Generalized anxiety disorder: Secondary | ICD-10-CM

## 2020-10-25 NOTE — Progress Notes (Deleted)
[facility] at [facility] [address], [address] [place], [place] [address] [phone]  Date:  10/30/2020   Name:  Danielle [name]   DOB:  12-06-1980   MRN:  [id number]  PCP:  Pearline Cables, MD    Chief Complaint: No chief complaint on file.   History of Present Illness:  Danielle [name] is a 40 y.o. very pleasant female patient who presents with the following:  Virtual visit for CPE today Last seen by myself  History of anxiety/panic disorder, ADD, tachycardia, significant spine disease  She had a re-do of lumbar laminectomy in March of this year   Flu vaccine   Patient Active Problem List   Diagnosis Date Noted   Sinus tachycardia 07/14/2012   History of panic disorder 07/14/2012   History of kidney stones 07/14/2012   DDD (degenerative disc disease), lumbar 07/14/2012   History of ovarian cyst 07/14/2012   ADHD (attention deficit hyperactivity disorder) 07/12/2012    Past Medical History:  Diagnosis Date   Allergy    Anxiety    Gall stones    Kidney stones     Past Surgical History:  Procedure Laterality Date   WISDOM TOOTH EXTRACTION      Social History   Tobacco Use   Smoking status: Former    Packs/day: 0.50    Types: Cigarettes   Smokeless tobacco: Never  Substance Use Topics   Alcohol use: Yes   Drug use: No    Family History  Problem Relation Age of Onset   Hypertension Mother    Hyperlipidemia Mother    Heart disease Mother    Hyperlipidemia Father    Hyperlipidemia Brother    Hyperlipidemia Brother     Allergies  Allergen Reactions   Beef (Bovine) Protein Anaphylaxis   Pork Allergy Anaphylaxis   Amoxicillin Rash   Penicillins Rash    Medication list has been reviewed and updated.  Current Outpatient Medications on File Prior to Visit  Medication Sig Dispense Refill   ALPRAZolam (XANAX) 0.5 MG tablet TAKE 1 TABLET BY MOUTH DAILY AT MIDDAY AND AT BEDTIME. MAY TAKE 1 EXTRA DURING  THE DAY AS NEEDED 80 tablet 0   amphetamine-dextroamphetamine (ADDERALL XR) 30 MG 24 hr capsule Take 1 capsule (30 mg total) by mouth every morning. 30 capsule 0   amphetamine-dextroamphetamine (ADDERALL XR) 30 MG 24 hr capsule Take 1 capsule (30 mg total) by mouth every morning. 30 capsule 0   amphetamine-dextroamphetamine (ADDERALL XR) 30 MG 24 hr capsule Take 1 capsule (30 mg total) by mouth every morning. 30 capsule 0   amphetamine-dextroamphetamine (ADDERALL) 10 MG tablet Take one in the early afternoon as needed. 30 tablet 0   amphetamine-dextroamphetamine (ADDERALL) 10 MG tablet Take 1 in the early afternoon as needed. 30 tablet 0   amphetamine-dextroamphetamine (ADDERALL) 10 MG tablet Take 1 in the early afternoon as needed. 30 tablet 0   meloxicam (MOBIC) 7.5 MG tablet TAKE ONE TABLET BY MOUTH DAILY. USE AS NEEDED FOR BACK PAIN 30 tablet 1   methocarbamol (ROBAXIN) 500 MG tablet Take 1 tablet (500 mg total) by mouth every 6 (six) hours as needed for muscle spasms. 120 tablet 3   metoprolol succinate (TOPROL-XL) 100 MG 24 hr tablet TAKE 1 TABLET BY MOUTH EVERY DAY WITH OR IMMEDIATELY FOLLOWING A MEAL 90 tablet 3   OVER THE COUNTER MEDICATION OTC Mammalian Protein Allergy taking     PROAIR RESPICLICK 108 (90  Base) MCG/ACT AEPB INHALE 2 PUFFS INTO THE LUNGS THREE TIMES DAILY AS NEEDED 1 each 3   No current facility-administered medications on file prior to visit.    Review of Systems:  As per HPI- otherwise negative.   Physical Examination: There were no vitals filed for this visit. There were no vitals filed for this visit. There is no height or weight on file to calculate BMI. Ideal Body Weight:    GEN: no acute distress. HEENT: Atraumatic, Normocephalic.  Ears and Nose: No external deformity. CV: RRR, No M/G/R. No JVD. No thrill. No extra heart sounds. PULM: CTA B, no wheezes, crackles, rhonchi. No retractions. No resp. distress. No accessory muscle use. ABD: S, NT, ND, +BS.  No rebound. No HSM. EXTR: No c/c/e PSYCH: Normally interactive. Conversant.    Assessment and Plan: ***  Signed Abbe Amsterdam, MD

## 2020-10-30 ENCOUNTER — Other Ambulatory Visit: Payer: Self-pay

## 2020-10-30 ENCOUNTER — Telehealth (INDEPENDENT_AMBULATORY_CARE_PROVIDER_SITE_OTHER): Payer: Self-pay | Admitting: Family Medicine

## 2020-10-30 DIAGNOSIS — Z91199 Patient's noncompliance with other medical treatment and regimen due to unspecified reason: Secondary | ICD-10-CM

## 2020-10-30 NOTE — Progress Notes (Signed)
Call patient 3 times, no answer.  Left message on machine

## 2020-11-04 NOTE — Progress Notes (Signed)
[facility] [address] [place], [place] [address] [phone]  Date:  11/06/2020   Name:  Danielle [name]   DOB:  [birth year]   MRN:  [id number]  PCP:  Pearline Cables, MD    Chief Complaint: No chief complaint on file.   History of Present Illness:  Danielle [name] is a 40 y.o. very pleasant female patient who presents with the following:  Virtual visit today for physical exam-reschedule, unfortunately her her dog was very sick at the time of our last visit.  They took him to the vet and he is ok   Patient location is home, provider location is office.  Patient identity confirmed with 2 factors, she gives consent for virtual visit today The patient and myself are present on the call -she gives consent for virtual visit today  History of anxiety/panic disorder, ADD, tachycardia, significant spine disease  Her spine surgeon is [facility] in [place], Dr. Alden Hipp She underwent a revision lumbar decompression in April.  From recent follow-up note ASSESSMENT: Status post lumbar decompression with persistent left lower extremity radiculopathy. PLAN: I have reviewed her MRI scan that failed to demonstrate any significant neural compressive lesion. I have recommended increasing her gabapentin to 600 mg 3 times daily. We briefly discussed the pain clinic and refilled her medications. Yazmen is in agreement with this plan, and all of her questions have been answered. I would like to see the patient back in 3 months.  She saw her surgeon most recently after her last MRI.  She seems to have some permanent nerve damage which causes pain She is on gabapentin 600 TID,  She is also on narcotic pain control, Soma as needed for muscle spasm  She is still not driving She is not able to work now - she is out through January, then will re-assess.  She is eager to get back to work  She and her partner did get married-  Arline Asp  I am treating for ADD with Adderall XR 30 mg daily- she also has an rx for adderall 10mg  in the afternoon.   However she has not been able to get it filled recently due to a backorder.  I will call her [facility] and see what the issue is   COVID-19 booster- recommended  Flu shot- recommended that she get this done  Blood work- has been done by her .  For the time being she does not wish to have any more labs drawn  She gets her pap through GYN  She was seen recently and her BP was ok Weight is 115 which is stable for her  Patient Active Problem List   Diagnosis Date Noted   Sinus tachycardia 07/14/2012   History of panic disorder 07/14/2012   History of kidney stones 07/14/2012   DDD (degenerative disc disease), lumbar 07/14/2012   History of ovarian cyst 07/14/2012   ADHD (attention deficit hyperactivity disorder) 07/12/2012    Past Medical History:  Diagnosis Date   Allergy    Anxiety    Gall stones    Kidney stones     Past Surgical History:  Procedure Laterality Date   WISDOM TOOTH EXTRACTION      Social History   Tobacco Use   Smoking status: Former    Packs/day: 0.50    Types: Cigarettes   Smokeless tobacco: Never  Substance Use Topics   Alcohol use: Yes   Drug  use: No    Family History  Problem Relation Age of Onset   Hypertension Mother    Hyperlipidemia Mother    Heart disease Mother    Hyperlipidemia Father    Hyperlipidemia Brother    Hyperlipidemia Brother     Allergies  Allergen Reactions   Beef (Bovine) Protein Anaphylaxis   Pork Allergy Anaphylaxis   Amoxicillin Rash   Penicillins Rash    Medication list has been reviewed and updated.  Current Outpatient Medications on File Prior to Visit  Medication Sig Dispense Refill   ALPRAZolam (XANAX) 0.5 MG tablet TAKE 1 TABLET BY MOUTH DAILY AT MIDDAY AND AT BEDTIME. MAY TAKE 1 EXTRA DURING THE DAY AS NEEDED 80 tablet 0   amphetamine-dextroamphetamine (ADDERALL XR) 30 MG 24 hr capsule  Take 1 capsule (30 mg total) by mouth every morning. 30 capsule 0   amphetamine-dextroamphetamine (ADDERALL XR) 30 MG 24 hr capsule Take 1 capsule (30 mg total) by mouth every morning. 30 capsule 0   amphetamine-dextroamphetamine (ADDERALL XR) 30 MG 24 hr capsule Take 1 capsule (30 mg total) by mouth every morning. 30 capsule 0   amphetamine-dextroamphetamine (ADDERALL) 10 MG tablet Take one in the early afternoon as needed. 30 tablet 0   amphetamine-dextroamphetamine (ADDERALL) 10 MG tablet Take 1 in the early afternoon as needed. 30 tablet 0   amphetamine-dextroamphetamine (ADDERALL) 10 MG tablet Take 1 in the early afternoon as needed. 30 tablet 0   meloxicam (MOBIC) 7.5 MG tablet TAKE ONE TABLET BY MOUTH DAILY. USE AS NEEDED FOR BACK PAIN 30 tablet 1   methocarbamol (ROBAXIN) 500 MG tablet Take 1 tablet (500 mg total) by mouth every 6 (six) hours as needed for muscle spasms. 120 tablet 3   metoprolol succinate (TOPROL-XL) 100 MG 24 hr tablet TAKE 1 TABLET BY MOUTH EVERY DAY WITH OR IMMEDIATELY FOLLOWING A MEAL 90 tablet 3   OVER THE COUNTER MEDICATION OTC Mammalian Protein Allergy taking     PROAIR RESPICLICK 108 (90 Base) MCG/ACT AEPB INHALE 2 PUFFS INTO THE LUNGS THREE TIMES DAILY AS NEEDED 1 each 3   No current facility-administered medications on file prior to visit.    Review of Systems:  As per HPI- otherwise negative.   Physical Examination: There were no vitals filed for this visit. There were no vitals filed for this visit. There is no height or weight on file to calculate BMI. Ideal Body Weight:    Connected with pt via mychart video  She looks well, normal self  Assessment and Plan: Physical exam  Attention deficit hyperactivity disorder (ADHD), predominantly inattentive type - Plan: amphetamine-dextroamphetamine (ADDERALL XR) 30 MG 24 hr capsule, amphetamine-dextroamphetamine (ADDERALL XR) 30 MG 24 hr capsule, amphetamine-dextroamphetamine (ADDERALL XR) 30 MG 24 hr  capsule, amphetamine-dextroamphetamine (ADDERALL) 20 MG tablet  Immunization due  GAD (generalized anxiety disorder)  Chronic low back pain, unspecified back pain laterality, unspecified whether sciatica present  Patient seen today for physical exam, virtual. She suffers from chronic back pain, had a revision surgery in April.  She is still unfortunately not able to do drive She notes that anxiety continues, she is using her Xanax as prescribed  She also uses Adderall XR 30 mg daily.  She is not yet due for refill, but I sent refills to her pharmacy to have on hand.  She typically takes 10 mg of regular release Adderall around noon, but this has recently been on backorder.  I confirmed with pharmacy that the 10 mg is not available,  I prescribed 20 mg instead, she can take 1/2 tablet  Encourage routine recommended immunizations such as flu and COVID-19  Signed Abbe Amsterdam, MD

## 2020-11-06 ENCOUNTER — Telehealth (INDEPENDENT_AMBULATORY_CARE_PROVIDER_SITE_OTHER): Payer: 59 | Admitting: Family Medicine

## 2020-11-06 ENCOUNTER — Other Ambulatory Visit: Payer: Self-pay

## 2020-11-06 DIAGNOSIS — Z23 Encounter for immunization: Secondary | ICD-10-CM | POA: Diagnosis not present

## 2020-11-06 DIAGNOSIS — F411 Generalized anxiety disorder: Secondary | ICD-10-CM | POA: Diagnosis not present

## 2020-11-06 DIAGNOSIS — F900 Attention-deficit hyperactivity disorder, predominantly inattentive type: Secondary | ICD-10-CM

## 2020-11-06 DIAGNOSIS — G8929 Other chronic pain: Secondary | ICD-10-CM

## 2020-11-06 DIAGNOSIS — Z0000 Encounter for general adult medical examination without abnormal findings: Secondary | ICD-10-CM

## 2020-11-06 DIAGNOSIS — M545 Low back pain, unspecified: Secondary | ICD-10-CM

## 2020-11-06 MED ORDER — AMPHETAMINE-DEXTROAMPHET ER 30 MG PO CP24: 30.0000 mg | ORAL_CAPSULE | ORAL | 0 refills | Status: DC

## 2020-11-06 MED ORDER — AMPHETAMINE-DEXTROAMPHETAMINE 20 MG PO TABS: ORAL_TABLET | ORAL | 0 refills | Status: DC

## 2020-11-18 ENCOUNTER — Other Ambulatory Visit: Payer: Self-pay | Admitting: Family Medicine

## 2020-11-18 DIAGNOSIS — F411 Generalized anxiety disorder: Secondary | ICD-10-CM

## 2020-11-18 DIAGNOSIS — F900 Attention-deficit hyperactivity disorder, predominantly inattentive type: Secondary | ICD-10-CM

## 2020-11-19 ENCOUNTER — Encounter: Payer: Self-pay | Admitting: Family Medicine

## 2020-11-19 MED ORDER — ALPRAZOLAM 0.5 MG PO TABS: ORAL_TABLET | ORAL | 0 refills | Status: DC

## 2020-12-08 ENCOUNTER — Other Ambulatory Visit: Payer: Self-pay | Admitting: Family Medicine

## 2020-12-08 DIAGNOSIS — F411 Generalized anxiety disorder: Secondary | ICD-10-CM

## 2020-12-30 ENCOUNTER — Telehealth: Payer: Self-pay

## 2020-12-30 NOTE — Telephone Encounter (Signed)
urse Assessment Nurse: Michell Heinrich, RN, Lanora Manis Date/Time (Eastern Time): 12/28/2020 8:38:18 AM Confirm and document reason for call. If symptomatic, describe symptoms. ---Caller states that she has had 2 back surgeries in the last year. States that her leg is normally weak. States that she fell, and her knee is now swollen. States that it is yellow/blue. States very painful to walk on it. Does the patient have any new or worsening symptoms? ---Yes Will a triage be completed? ---Yes Related visit to physician within the last 2 weeks? ---No Does the PT have any chronic conditions? (i.e. diabetes, asthma, this includes High risk factors for pregnancy, etc.) ---Yes List chronic conditions. ---hx of back surgeries tachycardia Is the patient pregnant or possibly pregnant? (Ask all females between the ages of 89-55) ---No Is this a behavioral health or substance abuse call? ---No Guidelines Guideline Title Affirmed Question Affirmed Notes Nurse Date/Time (Eastern Time) Knee Injury [1] High-risk adult (e.g., age > 60 years, osteoporosis, chronic steroid use) AND [2] limping Cantrell, RN, Lanora Manis 12/28/2020 8:39:19 AM PLEASE NOTE: All timestamps contained within this report are represented as [place] Standard Time. CONFIDENTIALTY NOTICE: This fax transmission is intended only for the addressee. It contains information that is legally privileged, confidential or otherwise protected from use or disclosure. If you are not the intended recipient, you are strictly prohibited from reviewing, disclosing, copying using or disseminating any of this information or taking any action in reliance on or regarding this information. If you have received this fax in error, please notify us immediately by telephone so that we can arrange for its return to [place]. Phone: [phone], Toll-Free: [phone], Fax: [phone] Page: 2 of 2 Call Id: [id number] Disp. Time Lamount Cohen Time) Disposition Final  User 12/28/2020 8:26:32 AM Attempt made - message left Cantrell, RN, Lanora Manis 12/28/2020 8:48:25 AM See HCP within 4 Hours (or PCP triage) Yes Cantrell, RN, Lanora Manis Disposition Overriden: See PCP within 24 Hours Override Reason: Specify reason. (Please document in 'advice recommended' section) Caller Disagree/Comply Comply Caller Understands Yes PreDisposition Home Care Care Advice Given Per Guideline SEE HCP (OR PCP TRIAGE) WITHIN 4 HOURS: * IF OFFICE WILL BE CLOSED AND NO PCP (PRIMARY CARE PROVIDER) SECOND-LEVEL TRIAGE: You need to be seen within the next 3 or 4 hours. A nearby Urgent Care Center [facility]) is often a good source of care. Another choice is to go to the ED. Go sooner if you become worse. CALL BACK IF: * You become worse CARE ADVICE given per Knee Injury (Adult) guideline. USE A COLD PACK FOR PAIN, SWELLING, OR BRUISING: * Put a cold pack or an ice bag (wrapped in a moist towel) on the area for 20 minutes. * Repeat in 1 hour, then every 4 hours while awake. * Continue this for the first 48 hours (2 days) after an injury. * Caution: Avoid frostbite. NO STANDING: * Try not to put any weight on the injured leg. Comments User: Patrica Duel, RN Date/Time Lamount Cohen Time): 12/28/2020 8:50:08 AM Upgraded d/t pt's medical history. Pt has constant numbness/coldness in foot. States unchanged from normal. Referrals [facility]

## 2021-01-01 ENCOUNTER — Other Ambulatory Visit: Payer: Self-pay | Admitting: Family Medicine

## 2021-01-01 DIAGNOSIS — F900 Attention-deficit hyperactivity disorder, predominantly inattentive type: Secondary | ICD-10-CM

## 2021-01-01 MED ORDER — AMPHETAMINE-DEXTROAMPHETAMINE 20 MG PO TABS: ORAL_TABLET | ORAL | 0 refills | Status: DC

## 2021-01-01 NOTE — Telephone Encounter (Signed)
Called to follow up since this was sent while I was out. Pt says she took it easy and used Ice. She feels a lot better.

## 2021-01-22 ENCOUNTER — Other Ambulatory Visit: Payer: Self-pay | Admitting: Family Medicine

## 2021-01-22 DIAGNOSIS — F900 Attention-deficit hyperactivity disorder, predominantly inattentive type: Secondary | ICD-10-CM

## 2021-01-22 MED ORDER — AMPHETAMINE-DEXTROAMPHETAMINE 20 MG PO TABS: ORAL_TABLET | ORAL | 0 refills | Status: DC

## 2021-02-04 ENCOUNTER — Other Ambulatory Visit: Payer: Self-pay | Admitting: Family Medicine

## 2021-02-04 DIAGNOSIS — F900 Attention-deficit hyperactivity disorder, predominantly inattentive type: Secondary | ICD-10-CM

## 2021-02-04 MED ORDER — AMPHETAMINE-DEXTROAMPHETAMINE 10 MG PO TABS: ORAL_TABLET | ORAL | 0 refills | Status: DC

## 2021-02-04 NOTE — Telephone Encounter (Signed)
Patient called wanting Dr. Lorelei Pont to send the 30 pills 10mg s to the pharmacy instead, due to their 20mg  shortage. Please advice.

## 2021-02-12 ENCOUNTER — Encounter: Payer: Self-pay | Admitting: Family Medicine

## 2021-02-14 NOTE — Telephone Encounter (Signed)
Forms have been faxed 

## 2021-02-20 ENCOUNTER — Telehealth: Payer: Self-pay | Admitting: Family Medicine

## 2021-02-20 ENCOUNTER — Other Ambulatory Visit: Payer: Self-pay | Admitting: Family Medicine

## 2021-02-20 DIAGNOSIS — F900 Attention-deficit hyperactivity disorder, predominantly inattentive type: Secondary | ICD-10-CM

## 2021-02-20 MED ORDER — AMPHETAMINE-DEXTROAMPHET ER 30 MG PO CP24: 30.0000 mg | ORAL_CAPSULE | ORAL | 0 refills | Status: DC

## 2021-02-20 NOTE — Telephone Encounter (Signed)
I have called the pharmacy and it looks like they do have the medication refills on file, however it is on back order.   I have tried to call the pt and had no success. I did leave a message stating that she may have to call different pharmacies due to this medication being on a shortage. She can call back if she has any additional questions.

## 2021-02-20 NOTE — Telephone Encounter (Signed)
Pt stated she got a call regarding her adderall being out of stock. She stated she would like to up the dosage to 30 mg. Please advise.

## 2021-02-21 ENCOUNTER — Encounter: Payer: Self-pay | Admitting: Family Medicine

## 2021-02-21 NOTE — Telephone Encounter (Signed)
Please advise 

## 2021-02-22 ENCOUNTER — Other Ambulatory Visit: Payer: Self-pay | Admitting: Family Medicine

## 2021-02-22 DIAGNOSIS — R000 Tachycardia, unspecified: Secondary | ICD-10-CM

## 2021-03-10 ENCOUNTER — Encounter: Payer: Self-pay | Admitting: Family Medicine

## 2021-03-10 DIAGNOSIS — F900 Attention-deficit hyperactivity disorder, predominantly inattentive type: Secondary | ICD-10-CM

## 2021-03-11 MED ORDER — AMPHETAMINE-DEXTROAMPHETAMINE 10 MG PO TABS: ORAL_TABLET | ORAL | 0 refills | Status: DC

## 2021-03-11 NOTE — Telephone Encounter (Signed)
Pt has called the office to inform us that she has finally found a pharmacy that has adderall in stock. Please see my chart message below.

## 2021-03-12 MED ORDER — AMPHETAMINE-DEXTROAMPHETAMINE 15 MG PO TABS: 15.0000 mg | ORAL_TABLET | Freq: Every day | ORAL | 0 refills | Status: DC

## 2021-03-12 NOTE — Addendum Note (Signed)
Addended by: Abbe Amsterdam C on: 03/12/2021 08:21 AM ? ? Modules accepted: Orders ? ?

## 2021-03-20 ENCOUNTER — Other Ambulatory Visit: Payer: Self-pay | Admitting: Family Medicine

## 2021-03-20 DIAGNOSIS — F411 Generalized anxiety disorder: Secondary | ICD-10-CM

## 2021-04-21 ENCOUNTER — Other Ambulatory Visit: Payer: Self-pay | Admitting: Family Medicine

## 2021-04-21 DIAGNOSIS — F900 Attention-deficit hyperactivity disorder, predominantly inattentive type: Secondary | ICD-10-CM

## 2021-04-21 MED ORDER — AMPHETAMINE-DEXTROAMPHET ER 30 MG PO CP24: 30.0000 mg | ORAL_CAPSULE | ORAL | 0 refills | Status: DC

## 2021-06-10 ENCOUNTER — Other Ambulatory Visit: Payer: Self-pay | Admitting: Family Medicine

## 2021-06-10 ENCOUNTER — Encounter: Payer: Self-pay | Admitting: Family Medicine

## 2021-06-10 DIAGNOSIS — F900 Attention-deficit hyperactivity disorder, predominantly inattentive type: Secondary | ICD-10-CM

## 2021-06-10 DIAGNOSIS — F411 Generalized anxiety disorder: Secondary | ICD-10-CM

## 2021-06-11 ENCOUNTER — Other Ambulatory Visit: Payer: Self-pay | Admitting: Family Medicine

## 2021-06-11 DIAGNOSIS — F411 Generalized anxiety disorder: Secondary | ICD-10-CM

## 2021-06-11 MED ORDER — ALPRAZOLAM 0.5 MG PO TABS
ORAL_TABLET | ORAL | 2 refills | Status: DC
Start: 2021-06-11 — End: 2021-09-11

## 2021-06-11 MED ORDER — AMPHETAMINE-DEXTROAMPHET ER 30 MG PO CP24: 30.0000 mg | ORAL_CAPSULE | ORAL | 0 refills | Status: DC

## 2021-06-14 NOTE — Progress Notes (Signed)
[facility] at [facility] [address] [place], [place] [address] [phone]  Date:  06/16/2021   Name:  Danielle [name]   DOB:  [birth year]   MRN:  [id number]  PCP:  Pearline Cables, MD    Chief Complaint: Medication Refill (Concerns/ questions: cold sxs/)   History of Present Illness:  Danielle [name] is a 41 y.o. very pleasant female patient who presents with the following:  Patient seen virtually today for medication review Patient location is home, my location is office.  Patient identity confirmed with 2 factors, she gives consent for virtual visit today The patient and myself are present on the call today Last seen by myself also virtually in October- History of anxiety/panic disorder, ADD, tachycardia, significant spine disease  Her ADD is treated with Adderall XR 30 mg daily, 10 mg immediate release as needed daily- she feels like this is working ok for her She will take it although she is not working right now in order to do her daily tasks She will often take the 10 mg around 2pm - her sleep is disrupted due to her back pain   She was seen by her orthopedist on 6/1, history of thoracic laminoforaminectomy with stimulator placement trial so far- this was helpful so they plan to do the permanent stimulator soon  They are treating her with hydrocodone and Soma for chronic back pain  She has been on long term disability for about 2 years- she may have having some issues with her insurance but she is working on getting this resolved  I am also giving her alprazolam-discussed this in the setting of narcotic use She is taking 0.5 mg 2-3x a day; depending on her stress level.  She generally takes one after lunch and at bedtime  She notes she has been on this routine for many years, she has not noted any unusual sedation She does have narcan at home  Patient Active Problem List   Diagnosis Date Noted   Sinus tachycardia  07/14/2012   History of panic disorder 07/14/2012   History of kidney stones 07/14/2012   DDD (degenerative disc disease), lumbar 07/14/2012   History of ovarian cyst 07/14/2012   ADHD (attention deficit hyperactivity disorder) 07/12/2012    Past Medical History:  Diagnosis Date   Allergy    Anxiety    Gall stones    Kidney stones     Past Surgical History:  Procedure Laterality Date   WISDOM TOOTH EXTRACTION      Social History   Tobacco Use   Smoking status: Former    Packs/day: 0.50    Types: Cigarettes   Smokeless tobacco: Never  Substance Use Topics   Alcohol use: Yes   Drug use: No    Family History  Problem Relation Age of Onset   Hypertension Mother    Hyperlipidemia Mother    Heart disease Mother    Hyperlipidemia Father    Hyperlipidemia Brother    Hyperlipidemia Brother     Allergies  Allergen Reactions   Beef (Bovine) Protein Anaphylaxis   Pork Allergy Anaphylaxis   Amoxicillin Rash   Penicillins Rash    Medication list has been reviewed and updated.  Current Outpatient Medications on File Prior to Visit  Medication Sig Dispense Refill   ALPRAZolam (XANAX) 0.5 MG tablet TAKE 1 TABLET BY MOUTH DAILY AT NOON AND AT BEDTIME. MAY TAKE 1 EXTRA DURING THE DAY AS NEEDED  80 tablet 2   amphetamine-dextroamphetamine (ADDERALL XR) 30 MG 24 hr capsule Take 1 capsule (30 mg total) by mouth every morning. 30 capsule 0   amphetamine-dextroamphetamine (ADDERALL XR) 30 MG 24 hr capsule Take 1 capsule (30 mg total) by mouth every morning. 30 capsule 0   amphetamine-dextroamphetamine (ADDERALL XR) 30 MG 24 hr capsule Take 1 capsule (30 mg total) by mouth every morning. 30 capsule 0   amphetamine-dextroamphetamine (ADDERALL) 10 MG tablet Take one in the early afternoon as needed. 30 tablet 0   amphetamine-dextroamphetamine (ADDERALL) 10 MG tablet Take 1 in the early afternoon as needed. 30 tablet 0   amphetamine-dextroamphetamine (ADDERALL) 15 MG tablet Take 1  tablet by mouth daily. Take in the early afternoon as needed 30 tablet 0   amphetamine-dextroamphetamine (ADDERALL) 20 MG tablet Take a 1/2 tablet (10mg ) in the afternoon as needed 15 tablet 0   Cetirizine HCl (ZYRTEC ALLERGY PO) Take by mouth.     HYDROcodone-acetaminophen (NORCO/VICODIN) 5-325 MG tablet Take by mouth.     meloxicam (MOBIC) 7.5 MG tablet TAKE ONE TABLET BY MOUTH DAILY. USE AS NEEDED FOR BACK PAIN 30 tablet 1   methocarbamol (ROBAXIN) 500 MG tablet Take 1 tablet (500 mg total) by mouth every 6 (six) hours as needed for muscle spasms. 120 tablet 3   metoprolol succinate (TOPROL-XL) 100 MG 24 hr tablet TAKE 1 TABLET BY MOUTH EVERY DAY WITH OR IMMEDIATELY FOLLOWING A MEAL 90 tablet 3   OVER THE COUNTER MEDICATION OTC Mammalian Protein Allergy taking     PROAIR RESPICLICK 108 (90 Base) MCG/ACT AEPB INHALE 2 PUFFS INTO THE LUNGS THREE TIMES DAILY AS NEEDED 1 each 3   No current facility-administered medications on file prior to visit.    Review of Systems:  As per HPI- otherwise negative.   Physical Examination: Vitals:   06/16/21 1132  Pulse: 75  Resp: 18   There were no vitals filed for this visit. There is no height or weight on file to calculate BMI. Ideal Body Weight:    Patient observed via video monitor.  She looks well and her normal self  Assessment and Plan: Attention deficit hyperactivity disorder (ADHD), predominantly inattentive type  GAD (generalized anxiety disorder)  Chronic low back pain, unspecified back pain laterality, unspecified whether sciatica present  Following up today on medication refills.  I am treating 08/16/21 for her ADD with Adderall, treat anxiety with alprazolam.  She does not need any refills today but will let me know when she does Discussed safe use of medication, I encouraged her to try half dose of alprazolam when she is concurrently taking pain medication. She will let me know when refills are needed, plan to recheck in 6  months   Signed [name], MD

## 2021-06-16 ENCOUNTER — Telehealth (INDEPENDENT_AMBULATORY_CARE_PROVIDER_SITE_OTHER): Payer: 59 | Admitting: Family Medicine

## 2021-06-16 VITALS — HR 75 | Resp 18

## 2021-06-16 DIAGNOSIS — F900 Attention-deficit hyperactivity disorder, predominantly inattentive type: Secondary | ICD-10-CM | POA: Diagnosis not present

## 2021-06-16 DIAGNOSIS — G8929 Other chronic pain: Secondary | ICD-10-CM

## 2021-06-16 DIAGNOSIS — M545 Low back pain, unspecified: Secondary | ICD-10-CM | POA: Diagnosis not present

## 2021-06-16 DIAGNOSIS — F411 Generalized anxiety disorder: Secondary | ICD-10-CM | POA: Diagnosis not present

## 2021-07-10 ENCOUNTER — Other Ambulatory Visit: Payer: Self-pay | Admitting: Family Medicine

## 2021-07-10 DIAGNOSIS — F900 Attention-deficit hyperactivity disorder, predominantly inattentive type: Secondary | ICD-10-CM

## 2021-07-11 MED ORDER — AMPHETAMINE-DEXTROAMPHET ER 30 MG PO CP24: 30.0000 mg | ORAL_CAPSULE | ORAL | 0 refills | Status: DC

## 2021-07-17 ENCOUNTER — Other Ambulatory Visit: Payer: Self-pay | Admitting: Family Medicine

## 2021-07-18 ENCOUNTER — Encounter: Payer: Self-pay | Admitting: Family Medicine

## 2021-07-18 NOTE — Telephone Encounter (Signed)
Called the pharmacy and the only meds that have been ran through this insurance so far are Soma, Alprazolam, and Adderall. And the Tresa Garter is the only one that needs a PA- of which you do not prescribe.

## 2021-07-27 ENCOUNTER — Other Ambulatory Visit: Payer: Self-pay | Admitting: Family Medicine

## 2021-07-27 DIAGNOSIS — F900 Attention-deficit hyperactivity disorder, predominantly inattentive type: Secondary | ICD-10-CM

## 2021-07-28 ENCOUNTER — Other Ambulatory Visit: Payer: Self-pay | Admitting: Family Medicine

## 2021-07-28 ENCOUNTER — Encounter: Payer: Self-pay | Admitting: Family Medicine

## 2021-07-28 DIAGNOSIS — F900 Attention-deficit hyperactivity disorder, predominantly inattentive type: Secondary | ICD-10-CM

## 2021-07-28 MED ORDER — AMPHETAMINE-DEXTROAMPHETAMINE 10 MG PO TABS: ORAL_TABLET | ORAL | 0 refills | Status: DC

## 2021-07-28 NOTE — Telephone Encounter (Signed)
Medication was sent in 07/28/21

## 2021-08-14 ENCOUNTER — Other Ambulatory Visit: Payer: Self-pay | Admitting: Family Medicine

## 2021-08-14 DIAGNOSIS — F900 Attention-deficit hyperactivity disorder, predominantly inattentive type: Secondary | ICD-10-CM

## 2021-08-14 MED ORDER — AMPHETAMINE-DEXTROAMPHET ER 30 MG PO CP24: 30.0000 mg | ORAL_CAPSULE | ORAL | 0 refills | Status: DC

## 2021-09-09 ENCOUNTER — Other Ambulatory Visit: Payer: Self-pay | Admitting: Family Medicine

## 2021-09-09 DIAGNOSIS — F900 Attention-deficit hyperactivity disorder, predominantly inattentive type: Secondary | ICD-10-CM

## 2021-09-09 MED ORDER — AMPHETAMINE-DEXTROAMPHET ER 30 MG PO CP24: 30.0000 mg | ORAL_CAPSULE | ORAL | 0 refills | Status: DC

## 2021-09-09 MED ORDER — AMPHETAMINE-DEXTROAMPHETAMINE 10 MG PO TABS: ORAL_TABLET | ORAL | 0 refills | Status: DC

## 2021-09-09 NOTE — Telephone Encounter (Signed)
Requesting: Adderall 10mg  and Adderall XR 30mg   Contract: 05/07/2016 UDS: 08/26/2017 Last Visit: 06/16/21 Next Visit: None Last Refill on Adderall XR 30mg : 08/14/21 #30 and 0RF Last Refill on Adderall 10mg : 07/28/21 #30 and 0RF  Please Advise

## 2021-09-11 ENCOUNTER — Encounter: Payer: Self-pay | Admitting: Family Medicine

## 2021-09-11 DIAGNOSIS — F411 Generalized anxiety disorder: Secondary | ICD-10-CM

## 2021-09-11 MED ORDER — ALPRAZOLAM 0.5 MG PO TABS: ORAL_TABLET | ORAL | 2 refills | Status: DC

## 2021-10-11 ENCOUNTER — Other Ambulatory Visit: Payer: Self-pay | Admitting: Family Medicine

## 2021-10-11 DIAGNOSIS — F900 Attention-deficit hyperactivity disorder, predominantly inattentive type: Secondary | ICD-10-CM

## 2021-10-12 ENCOUNTER — Other Ambulatory Visit: Payer: Self-pay | Admitting: Family Medicine

## 2021-10-12 DIAGNOSIS — F900 Attention-deficit hyperactivity disorder, predominantly inattentive type: Secondary | ICD-10-CM

## 2021-10-13 MED ORDER — AMPHETAMINE-DEXTROAMPHET ER 30 MG PO CP24: 30.0000 mg | ORAL_CAPSULE | ORAL | 0 refills | Status: DC

## 2021-10-22 ENCOUNTER — Encounter: Payer: Self-pay | Admitting: Family Medicine

## 2021-10-22 DIAGNOSIS — F900 Attention-deficit hyperactivity disorder, predominantly inattentive type: Secondary | ICD-10-CM

## 2021-10-22 DIAGNOSIS — Z87892 Personal history of anaphylaxis: Secondary | ICD-10-CM

## 2021-10-22 MED ORDER — EPINEPHRINE 0.3 MG/0.3ML IJ SOAJ: 0.3000 mg | Freq: Once | INTRAMUSCULAR | 1 refills | Status: AC

## 2021-10-22 MED ORDER — AMPHETAMINE-DEXTROAMPHETAMINE 10 MG PO TABS: ORAL_TABLET | ORAL | 0 refills | Status: DC

## 2021-10-22 MED ORDER — CETIRIZINE HCL 10 MG PO TABS: 10.0000 mg | ORAL_TABLET | Freq: Every day | ORAL | 3 refills | Status: DC

## 2021-12-09 ENCOUNTER — Encounter: Payer: Self-pay | Admitting: Family Medicine

## 2021-12-09 ENCOUNTER — Other Ambulatory Visit: Payer: Self-pay | Admitting: Family Medicine

## 2021-12-09 DIAGNOSIS — F900 Attention-deficit hyperactivity disorder, predominantly inattentive type: Secondary | ICD-10-CM

## 2021-12-09 MED ORDER — AMPHETAMINE-DEXTROAMPHET ER 30 MG PO CP24: 30.0000 mg | ORAL_CAPSULE | ORAL | 0 refills | Status: DC

## 2021-12-11 ENCOUNTER — Other Ambulatory Visit: Payer: Self-pay | Admitting: Family Medicine

## 2021-12-11 ENCOUNTER — Encounter: Payer: Self-pay | Admitting: Family Medicine

## 2021-12-11 DIAGNOSIS — F411 Generalized anxiety disorder: Secondary | ICD-10-CM

## 2021-12-12 MED ORDER — ALPRAZOLAM 0.5 MG PO TABS: ORAL_TABLET | ORAL | 2 refills | Status: DC

## 2021-12-14 ENCOUNTER — Encounter: Payer: Self-pay | Admitting: Family Medicine

## 2021-12-15 MED ORDER — AMPHETAMINE-DEXTROAMPHETAMINE 20 MG PO TABS: 10.0000 mg | ORAL_TABLET | Freq: Every day | ORAL | 0 refills | Status: DC

## 2021-12-15 NOTE — Addendum Note (Signed)
Addended by: Abbe Amsterdam C on: 12/15/2021 08:57 PM   Modules accepted: Orders

## 2021-12-23 NOTE — Progress Notes (Unsigned)
[facility] at [facility] [address] [place], [place] [address] [phone]  Date:  12/24/2021   Name:  RODOLFO NOTARO   DOB:  03-Mar-1980   MRN:  [id number]  PCP:  Pearline Cables, MD    Chief Complaint: No chief complaint on file.   History of Present Illness:  SUZANN LAZARO is a 42 y.o. very pleasant female patient who presents with the following:  Virtual visit today for 55-month follow-up Pt location is home, my location is office Patient identity confirmed with 2 factors, she gives consent for virtual visit today. The patient myself are present on the call today  Most recent visit with myself was also a virtual visit in June History of anxiety/panic disorder, ADD, tachycardia, significant spine disease   [name] takes Adderall 30 XR in the morning, 10 mg immediate release later in the day as needed  She is a spine patient at [facility] in [place]-Dr. Daubert She had a spinal cord stimulator placement since our last visit  I am prescribing just her stimulant medication and her alprazolam.  She did get a prescription for oxycodone and Soma last month- ?  Is the plan to continue the use or hopefully come off since her stimulator was placed-for now her pain is not really better, she is continue her pain medication The stimulator was placed 8/11 at the same time as her laminectomy- it was turned on a month after It has been re-calibrated 3x - so far it has not seemed to improve her pain situation  She is using oxycocone 5 every 4 hours since her surgery.  They are trying to get her in with pain management - was referred to [facility] Her surgeon is also rx Soma for her - there is some questions   She has not driven in 2 years- partially due to her surgery and also due to her surgery  She is seeing Dr Alden Hipp- her spine surgeon- on a regular basis  Patient Active Problem List   Diagnosis Date Noted   Sinus  tachycardia 07/14/2012   History of panic disorder 07/14/2012   History of kidney stones 07/14/2012   DDD (degenerative disc disease), lumbar 07/14/2012   History of ovarian cyst 07/14/2012   ADHD (attention deficit hyperactivity disorder) 07/12/2012    Past Medical History:  Diagnosis Date   Allergy    Anxiety    Gall stones    Kidney stones     Past Surgical History:  Procedure Laterality Date   WISDOM TOOTH EXTRACTION      Social History   Tobacco Use   Smoking status: Former    Packs/day: 0.50    Types: Cigarettes   Smokeless tobacco: Never  Substance Use Topics   Alcohol use: Yes   Drug use: No    Family History  Problem Relation Age of Onset   Hypertension Mother    Hyperlipidemia Mother    Heart disease Mother    Hyperlipidemia Father    Hyperlipidemia Brother    Hyperlipidemia Brother     Allergies  Allergen Reactions   Beef (Bovine) Protein Anaphylaxis   Pork Allergy Anaphylaxis   Amoxicillin Rash   Penicillins Rash    Medication list has been reviewed and updated.  Current Outpatient Medications on File Prior to Visit  Medication Sig Dispense Refill   ALPRAZolam (XANAX) 0.5 MG tablet TAKE 1 TABLET BY MOUTH DAILY AT NOON AND AT BEDTIME.  MAY TAKE 1 EXTRA DURING THE DAY AS NEEDED 80 tablet 2   amphetamine-dextroamphetamine (ADDERALL XR) 30 MG 24 hr capsule Take 1 capsule (30 mg total) by mouth every morning. 30 capsule 0   amphetamine-dextroamphetamine (ADDERALL) 10 MG tablet Take one in the early afternoon as needed. 30 tablet 0   amphetamine-dextroamphetamine (ADDERALL) 10 MG tablet Take one in the early afternoon as needed 30 tablet 0   amphetamine-dextroamphetamine (ADDERALL) 10 MG tablet Take one in the early afternoon as needed. 30 tablet 0   amphetamine-dextroamphetamine (ADDERALL) 20 MG tablet Take 0.5 tablets (10 mg total) by mouth daily. Use in the afternoon as needed 15 tablet 0   cetirizine (ZYRTEC) 10 MG tablet Take 1 tablet (10 mg  total) by mouth daily. 90 tablet 3   Cetirizine HCl (ZYRTEC ALLERGY PO) Take by mouth.     HYDROcodone-acetaminophen (NORCO/VICODIN) 5-325 MG tablet Take by mouth.     meloxicam (MOBIC) 7.5 MG tablet TAKE ONE TABLET BY MOUTH DAILY. USE AS NEEDED FOR BACK PAIN 30 tablet 1   methocarbamol (ROBAXIN) 500 MG tablet Take 1 tablet (500 mg total) by mouth every 6 (six) hours as needed for muscle spasms. 120 tablet 3   metoprolol succinate (TOPROL-XL) 100 MG 24 hr tablet TAKE 1 TABLET BY MOUTH EVERY DAY WITH OR IMMEDIATELY FOLLOWING A MEAL 90 tablet 3   OVER THE COUNTER MEDICATION OTC Mammalian Protein Allergy taking     PROAIR RESPICLICK 108 (90 Base) MCG/ACT AEPB INHALE 2 PUFFS INTO THE LUNGS THREE TIMES DAILY AS NEEDED 1 each 3   No current facility-administered medications on file prior to visit.    Review of Systems:  As per HPI- otherwise negative.   Physical Examination: There were no vitals filed for this visit. There were no vitals filed for this visit. There is no height or weight on file to calculate BMI. Ideal Body Weight:    Patient observed via video monitor.  She appears her normal self, no distress or shortness of breath is noted  Assessment and Plan: Attention deficit hyperactivity disorder (ADHD), predominantly inattentive type - Plan: amphetamine-dextroamphetamine (ADDERALL XR) 30 MG 24 hr capsule  History of anaphylaxis - Plan: cetirizine (ZYRTEC) 10 MG tablet  Tachycardia - Plan: metoprolol succinate (TOPROL-XL) 100 MG 24 hr tablet  Following up today.  She is maintained on Adderall XR 30 in the morning and 10 mg later in the day as needed.  She notes her ADHD symptoms are well-controlled on this combination.  Refill of the XR 30 today  Refilled her Zyrtec for allergies, metoprolol that she takes for tachycardia  She will come and see me in person when she can  Signed Abbe Amsterdam, MD

## 2021-12-24 ENCOUNTER — Telehealth (INDEPENDENT_AMBULATORY_CARE_PROVIDER_SITE_OTHER): Payer: BLUE CROSS/BLUE SHIELD | Admitting: Family Medicine

## 2021-12-24 DIAGNOSIS — R000 Tachycardia, unspecified: Secondary | ICD-10-CM | POA: Diagnosis not present

## 2021-12-24 DIAGNOSIS — Z87892 Personal history of anaphylaxis: Secondary | ICD-10-CM

## 2021-12-24 DIAGNOSIS — F900 Attention-deficit hyperactivity disorder, predominantly inattentive type: Secondary | ICD-10-CM | POA: Diagnosis not present

## 2021-12-24 MED ORDER — METOPROLOL SUCCINATE ER 100 MG PO TB24: ORAL_TABLET | ORAL | 3 refills | Status: DC

## 2021-12-24 MED ORDER — EPINEPHRINE 0.3 MG/0.3ML IJ SOAJ: 0.3000 mg | INTRAMUSCULAR | 99 refills | Status: DC | PRN

## 2021-12-24 MED ORDER — CETIRIZINE HCL 10 MG PO TABS: 10.0000 mg | ORAL_TABLET | Freq: Every day | ORAL | 3 refills | Status: DC

## 2021-12-24 MED ORDER — AMPHETAMINE-DEXTROAMPHET ER 30 MG PO CP24: 30.0000 mg | ORAL_CAPSULE | ORAL | 0 refills | Status: DC

## 2022-01-04 ENCOUNTER — Other Ambulatory Visit: Payer: Self-pay | Admitting: Family Medicine

## 2022-01-06 ENCOUNTER — Encounter: Payer: Self-pay | Admitting: Family Medicine

## 2022-01-06 MED ORDER — AMPHETAMINE-DEXTROAMPHETAMINE 20 MG PO TABS: 10.0000 mg | ORAL_TABLET | Freq: Every day | ORAL | 0 refills | Status: DC

## 2022-01-15 ENCOUNTER — Other Ambulatory Visit: Payer: Self-pay | Admitting: Family Medicine

## 2022-01-15 DIAGNOSIS — F900 Attention-deficit hyperactivity disorder, predominantly inattentive type: Secondary | ICD-10-CM

## 2022-01-16 MED ORDER — AMPHETAMINE-DEXTROAMPHET ER 30 MG PO CP24: 30.0000 mg | ORAL_CAPSULE | ORAL | 0 refills | Status: DC

## 2022-01-25 ENCOUNTER — Other Ambulatory Visit: Payer: Self-pay | Admitting: Family Medicine

## 2022-01-26 MED ORDER — AMPHETAMINE-DEXTROAMPHETAMINE 20 MG PO TABS: 10.0000 mg | ORAL_TABLET | Freq: Every day | ORAL | 0 refills | Status: DC

## 2022-02-04 ENCOUNTER — Other Ambulatory Visit: Payer: Self-pay | Admitting: Family Medicine

## 2022-02-04 DIAGNOSIS — R000 Tachycardia, unspecified: Secondary | ICD-10-CM

## 2022-02-24 ENCOUNTER — Other Ambulatory Visit: Payer: Self-pay | Admitting: Family Medicine

## 2022-02-24 DIAGNOSIS — F900 Attention-deficit hyperactivity disorder, predominantly inattentive type: Secondary | ICD-10-CM

## 2022-02-24 DIAGNOSIS — F411 Generalized anxiety disorder: Secondary | ICD-10-CM

## 2022-02-24 MED ORDER — AMPHETAMINE-DEXTROAMPHETAMINE 20 MG PO TABS: 10.0000 mg | ORAL_TABLET | Freq: Every day | ORAL | 0 refills | Status: DC

## 2022-02-24 MED ORDER — AMPHETAMINE-DEXTROAMPHET ER 30 MG PO CP24: 30.0000 mg | ORAL_CAPSULE | ORAL | 0 refills | Status: DC

## 2022-03-11 ENCOUNTER — Other Ambulatory Visit: Payer: Self-pay | Admitting: Family Medicine

## 2022-03-11 DIAGNOSIS — F900 Attention-deficit hyperactivity disorder, predominantly inattentive type: Secondary | ICD-10-CM

## 2022-03-11 MED ORDER — AMPHETAMINE-DEXTROAMPHET ER 30 MG PO CP24: 30.0000 mg | ORAL_CAPSULE | ORAL | 0 refills | Status: DC

## 2022-04-26 ENCOUNTER — Other Ambulatory Visit: Payer: Self-pay | Admitting: Family Medicine

## 2022-04-27 MED ORDER — AMPHETAMINE-DEXTROAMPHETAMINE 20 MG PO TABS: 10.0000 mg | ORAL_TABLET | Freq: Every day | ORAL | 0 refills | Status: DC

## 2022-05-26 ENCOUNTER — Other Ambulatory Visit: Payer: Self-pay | Admitting: Family Medicine

## 2022-05-27 MED ORDER — AMPHETAMINE-DEXTROAMPHETAMINE 20 MG PO TABS: 10.0000 mg | ORAL_TABLET | Freq: Every day | ORAL | 0 refills | Status: DC

## 2022-06-05 ENCOUNTER — Other Ambulatory Visit: Payer: Self-pay | Admitting: Family Medicine

## 2022-06-05 DIAGNOSIS — F900 Attention-deficit hyperactivity disorder, predominantly inattentive type: Secondary | ICD-10-CM

## 2022-06-09 MED ORDER — AMPHETAMINE-DEXTROAMPHET ER 30 MG PO CP24: 30.0000 mg | ORAL_CAPSULE | ORAL | 0 refills | Status: DC

## 2022-06-09 NOTE — Telephone Encounter (Signed)
Already sent one week ago. Please sign denial.

## 2022-06-16 ENCOUNTER — Other Ambulatory Visit: Payer: Self-pay | Admitting: Family Medicine

## 2022-06-21 ENCOUNTER — Other Ambulatory Visit: Payer: Self-pay | Admitting: Family Medicine

## 2022-06-22 ENCOUNTER — Encounter: Payer: Self-pay | Admitting: Family Medicine

## 2022-06-22 ENCOUNTER — Other Ambulatory Visit: Payer: Self-pay | Admitting: Family Medicine

## 2022-06-22 MED ORDER — AMPHETAMINE-DEXTROAMPHETAMINE 20 MG PO TABS: 10.0000 mg | ORAL_TABLET | Freq: Every day | ORAL | 0 refills | Status: DC

## 2022-06-22 NOTE — Telephone Encounter (Signed)
Okay for VV for 6 month f/u for these reasons?  - message sent that this is the pharmacy that her Rx was sent to.

## 2022-06-30 ENCOUNTER — Other Ambulatory Visit: Payer: Self-pay | Admitting: Family Medicine

## 2022-06-30 DIAGNOSIS — F900 Attention-deficit hyperactivity disorder, predominantly inattentive type: Secondary | ICD-10-CM

## 2022-07-01 MED ORDER — AMPHETAMINE-DEXTROAMPHET ER 30 MG PO CP24
30.0000 mg | ORAL_CAPSULE | ORAL | 0 refills | Status: DC
Start: 2022-07-01 — End: 2022-07-15

## 2022-07-06 ENCOUNTER — Encounter: Payer: Self-pay | Admitting: Family Medicine

## 2022-07-06 DIAGNOSIS — F900 Attention-deficit hyperactivity disorder, predominantly inattentive type: Secondary | ICD-10-CM

## 2022-07-06 MED ORDER — AMPHETAMINE-DEXTROAMPHET ER 30 MG PO CP24
30.0000 mg | ORAL_CAPSULE | ORAL | 0 refills | Status: AC
Start: 2022-07-06 — End: ?

## 2022-07-08 ENCOUNTER — Encounter: Payer: Self-pay | Admitting: Family Medicine

## 2022-07-14 NOTE — Progress Notes (Unsigned)
[facility] at [facility] [address], [address] [place], [place] [address] [phone]  Date:  07/15/2022   Name:  Danielle [name]   DOB:  Jul 05, 1980   MRN:  [id number]  PCP:  Danielle Cables, MD    Chief Complaint: No chief complaint on file.   History of Present Illness:  Danielle [name] is a 42 y.o. very pleasant female patient who presents with the following:  Patient seen virtually today for periodic follow-up Most recent visit with myself was in December  Pt location is home, my location is office Patient identity confirmed with 2 factors, she gives consent for virtual visit today. The patient myself are present on the call today   History of anxiety/panic disorder, ADD, tachycardia, significant spine disease    Danielle [name] takes Adderall 30 XR in the morning, 10 mg immediate release later in the day as needed   She is a spine patient at [facility] in [place]. Da[facility] They are still working on getting her stimulator to work for her She actually has an appointment to be seen later today Her pain is still an issue   She has been under some stress with her elderly dog being ill Patient Active Problem List   Diagnosis Date Noted   Sinus tachycardia 07/14/2012   History of panic disorder 07/14/2012   History of kidney stones 07/14/2012   DDD (degenerative disc disease), lumbar 07/14/2012   History of ovarian cyst 07/14/2012   ADHD (attention deficit hyperactivity disorder) 07/12/2012    Past Medical History:  Diagnosis Date   Allergy    Anxiety    Gall stones    Kidney stones     Past Surgical History:  Procedure Laterality Date   WISDOM TOOTH EXTRACTION      Social History   Tobacco Use   Smoking status: Former    Packs/day: .5    Types: Cigarettes   Smokeless tobacco: Never  Substance Use Topics   Alcohol use: Yes   Drug use: No    Family History  Problem Relation Age of Onset   Hypertension  Mother    Hyperlipidemia Mother    Heart disease Mother    Hyperlipidemia Father    Hyperlipidemia Brother    Hyperlipidemia Brother     Allergies  Allergen Reactions   Beef (Bovine) Protein Anaphylaxis   Pork Allergy Anaphylaxis   Amoxicillin Rash   Penicillins Rash    Medication list has been reviewed and updated.  Current Outpatient Medications on File Prior to Visit  Medication Sig Dispense Refill   ALPRAZolam (XANAX) 0.5 MG tablet TAKE 1 TABLET BY MOUTH DAILY AT NOON AND AT BEDTIME. MAY TAKE 1 EXTRA DURING THE DAY AS NEEDED 80 tablet 1   amphetamine-dextroamphetamine (ADDERALL XR) 30 MG 24 hr capsule Take 1 capsule (30 mg total) by mouth every morning. 30 capsule 0   amphetamine-dextroamphetamine (ADDERALL XR) 30 MG 24 hr capsule Take 1 capsule (30 mg total) by mouth every morning. 30 capsule 0   amphetamine-dextroamphetamine (ADDERALL) 10 MG tablet Take one in the early afternoon as needed. 30 tablet 0   amphetamine-dextroamphetamine (ADDERALL) 10 MG tablet Take one in the early afternoon as needed 30 tablet 0   amphetamine-dextroamphetamine (ADDERALL) 10 MG tablet Take one in the early afternoon as needed. 30 tablet 0   amphetamine-dextroamphetamine (ADDERALL) 20 MG tablet Take 0.5 tablets (10 mg total) by mouth daily. Use in the afternoon as needed  15 tablet 0   cetirizine (ZYRTEC) 10 MG tablet Take 1 tablet (10 mg total) by mouth daily. 90 tablet 3   EPINEPHrine (EPIPEN 2-PAK) 0.3 mg/0.3 mL IJ SOAJ injection Inject 0.3 mg into the muscle as needed for anaphylaxis. 2 each PRN   HYDROcodone-acetaminophen (NORCO/VICODIN) 5-325 MG tablet Take by mouth.     meloxicam (MOBIC) 7.5 MG tablet TAKE ONE TABLET BY MOUTH DAILY. USE AS NEEDED FOR BACK PAIN 30 tablet 1   methocarbamol (ROBAXIN) 500 MG tablet Take 1 tablet (500 mg total) by mouth every 6 (six) hours as needed for muscle spasms. 120 tablet 3   metoprolol succinate (TOPROL-XL) 100 MG 24 hr tablet TAKE 1 TABLET BY MOUTH EVERY  DAY WITH OR IMMEDIATELY FOLLOWING A MEAL 90 tablet 3   OVER THE COUNTER MEDICATION OTC Mammalian Protein Allergy taking     PROAIR RESPICLICK 108 (90 Base) MCG/ACT AEPB INHALE 2 PUFFS INTO THE LUNGS THREE TIMES DAILY AS NEEDED 1 each 3   No current facility-administered medications on file prior to visit.    Review of Systems:  As per HPI- otherwise negative.   Physical Examination: There were no vitals filed for this visit. There were no vitals filed for this visit. There is no height or weight on file to calculate BMI. Ideal Body Weight:    Patient observed via video monitor, she looks well her normal self  Assessment and Plan: GAD (generalized anxiety disorder) - Plan: ALPRAZolam (XANAX) 0.5 MG tablet  Attention deficit hyperactivity disorder (ADHD), predominantly inattentive type - Plan: amphetamine-dextroamphetamine (ADDERALL XR) 30 MG 24 hr capsule, amphetamine-dextroamphetamine (ADDERALL) 10 MG tablet  Following up today on medications as above.  Patient continues to use alprazolam, she takes Adderall XR in the morning and another 10 mg as needed later in the afternoon.  Provided refills today.  She continues to follow-up with orthopedics for her back pain  I have asked her to come in for an in person visit as soon as this is practical.  Signed Abbe Amsterdam, MD

## 2022-07-15 ENCOUNTER — Telehealth (INDEPENDENT_AMBULATORY_CARE_PROVIDER_SITE_OTHER): Payer: BLUE CROSS/BLUE SHIELD | Admitting: Family Medicine

## 2022-07-15 ENCOUNTER — Encounter: Payer: Self-pay | Admitting: Family Medicine

## 2022-07-15 DIAGNOSIS — F900 Attention-deficit hyperactivity disorder, predominantly inattentive type: Secondary | ICD-10-CM

## 2022-07-15 DIAGNOSIS — F411 Generalized anxiety disorder: Secondary | ICD-10-CM

## 2022-07-15 MED ORDER — ALPRAZOLAM 0.5 MG PO TABS
ORAL_TABLET | ORAL | 1 refills | Status: DC
Start: 2022-07-15 — End: 2022-09-10

## 2022-07-15 MED ORDER — AMPHETAMINE-DEXTROAMPHET ER 30 MG PO CP24: 30.0000 mg | ORAL_CAPSULE | ORAL | 0 refills | Status: DC

## 2022-07-15 MED ORDER — AMPHETAMINE-DEXTROAMPHETAMINE 10 MG PO TABS: ORAL_TABLET | ORAL | 0 refills | Status: DC

## 2022-07-15 NOTE — Telephone Encounter (Signed)
Were you able to see the pt?

## 2022-08-11 ENCOUNTER — Other Ambulatory Visit: Payer: Self-pay | Admitting: Family Medicine

## 2022-08-11 DIAGNOSIS — F900 Attention-deficit hyperactivity disorder, predominantly inattentive type: Secondary | ICD-10-CM

## 2022-08-11 MED ORDER — AMPHETAMINE-DEXTROAMPHET ER 30 MG PO CP24: 30.0000 mg | ORAL_CAPSULE | ORAL | 0 refills | Status: DC

## 2022-08-11 MED ORDER — AMPHETAMINE-DEXTROAMPHETAMINE 20 MG PO TABS: 10.0000 mg | ORAL_TABLET | Freq: Every day | ORAL | 0 refills | Status: DC

## 2022-09-07 ENCOUNTER — Other Ambulatory Visit: Payer: Self-pay | Admitting: Family Medicine

## 2022-09-07 DIAGNOSIS — F900 Attention-deficit hyperactivity disorder, predominantly inattentive type: Secondary | ICD-10-CM

## 2022-09-07 MED ORDER — AMPHETAMINE-DEXTROAMPHET ER 30 MG PO CP24
30.0000 mg | ORAL_CAPSULE | ORAL | 0 refills | Status: AC
Start: 2022-09-07 — End: ?

## 2022-09-07 MED ORDER — AMPHETAMINE-DEXTROAMPHETAMINE 20 MG PO TABS: 10.0000 mg | ORAL_TABLET | Freq: Every day | ORAL | 0 refills | Status: DC

## 2022-09-10 ENCOUNTER — Other Ambulatory Visit: Payer: Self-pay | Admitting: Family Medicine

## 2022-09-10 ENCOUNTER — Encounter: Payer: Self-pay | Admitting: Family Medicine

## 2022-09-10 DIAGNOSIS — F411 Generalized anxiety disorder: Secondary | ICD-10-CM

## 2022-09-10 MED ORDER — ALPRAZOLAM 0.5 MG PO TABS
ORAL_TABLET | ORAL | 1 refills | Status: DC
Start: 2022-09-10 — End: 2022-11-19

## 2022-10-05 ENCOUNTER — Other Ambulatory Visit: Payer: Self-pay | Admitting: Family Medicine

## 2022-10-05 DIAGNOSIS — F900 Attention-deficit hyperactivity disorder, predominantly inattentive type: Secondary | ICD-10-CM

## 2022-10-05 MED ORDER — AMPHETAMINE-DEXTROAMPHETAMINE 20 MG PO TABS
10.0000 mg | ORAL_TABLET | Freq: Every day | ORAL | 0 refills | Status: DC
Start: 2022-10-05 — End: 2022-11-05

## 2022-10-10 ENCOUNTER — Other Ambulatory Visit: Payer: Self-pay | Admitting: Family Medicine

## 2022-10-10 DIAGNOSIS — F900 Attention-deficit hyperactivity disorder, predominantly inattentive type: Secondary | ICD-10-CM

## 2022-10-14 ENCOUNTER — Encounter: Payer: Self-pay | Admitting: Family Medicine

## 2022-10-14 DIAGNOSIS — F900 Attention-deficit hyperactivity disorder, predominantly inattentive type: Secondary | ICD-10-CM

## 2022-10-15 MED ORDER — AMPHETAMINE-DEXTROAMPHET ER 30 MG PO CP24
30.0000 mg | ORAL_CAPSULE | ORAL | 0 refills | Status: DC
Start: 2022-10-15 — End: 2022-11-13

## 2022-10-15 MED ORDER — AMPHETAMINE-DEXTROAMPHET ER 30 MG PO CP24
30.0000 mg | ORAL_CAPSULE | ORAL | 0 refills | Status: DC
Start: 2022-10-15 — End: 2023-07-13

## 2022-11-04 ENCOUNTER — Encounter: Payer: Self-pay | Admitting: Family Medicine

## 2022-11-04 DIAGNOSIS — F900 Attention-deficit hyperactivity disorder, predominantly inattentive type: Secondary | ICD-10-CM

## 2022-11-05 MED ORDER — AMPHETAMINE-DEXTROAMPHETAMINE 20 MG PO TABS
10.0000 mg | ORAL_TABLET | Freq: Every day | ORAL | 0 refills | Status: DC
Start: 2022-11-05 — End: 2022-11-23

## 2022-11-10 LAB — HM PAP SMEAR

## 2022-11-10 LAB — RESULTS CONSOLE HPV: CHL HPV: NEGATIVE

## 2022-11-10 LAB — HM MAMMOGRAPHY

## 2022-11-13 ENCOUNTER — Other Ambulatory Visit: Payer: Self-pay | Admitting: Family Medicine

## 2022-11-13 DIAGNOSIS — F900 Attention-deficit hyperactivity disorder, predominantly inattentive type: Secondary | ICD-10-CM

## 2022-11-16 MED ORDER — AMPHETAMINE-DEXTROAMPHET ER 30 MG PO CP24
30.0000 mg | ORAL_CAPSULE | ORAL | 0 refills | Status: DC
Start: 2022-11-16 — End: 2022-12-14

## 2022-11-16 NOTE — Telephone Encounter (Signed)
Requesting: Adderall XR 30mg  Contract: N/A UDS: N/A Last Visit: 07/15/2022 VV Next Visit: N/A Last Refill: 10/15/2022  Please Advise

## 2022-11-19 ENCOUNTER — Encounter: Payer: Self-pay | Admitting: Family Medicine

## 2022-11-19 ENCOUNTER — Other Ambulatory Visit: Payer: Self-pay | Admitting: Family Medicine

## 2022-11-19 DIAGNOSIS — F411 Generalized anxiety disorder: Secondary | ICD-10-CM

## 2022-11-19 MED ORDER — ALPRAZOLAM 0.5 MG PO TABS
ORAL_TABLET | ORAL | 1 refills | Status: DC
Start: 2022-11-19 — End: 2023-02-01

## 2022-11-19 NOTE — Telephone Encounter (Signed)
Refill request has been request has been routed to you.

## 2022-11-23 ENCOUNTER — Other Ambulatory Visit: Payer: Self-pay | Admitting: Family Medicine

## 2022-11-23 DIAGNOSIS — F900 Attention-deficit hyperactivity disorder, predominantly inattentive type: Secondary | ICD-10-CM

## 2022-11-23 MED ORDER — AMPHETAMINE-DEXTROAMPHETAMINE 20 MG PO TABS: 10.0000 mg | ORAL_TABLET | Freq: Every day | ORAL | 0 refills | Status: DC

## 2022-12-14 ENCOUNTER — Other Ambulatory Visit: Payer: Self-pay | Admitting: Family Medicine

## 2022-12-14 DIAGNOSIS — F900 Attention-deficit hyperactivity disorder, predominantly inattentive type: Secondary | ICD-10-CM

## 2022-12-15 MED ORDER — AMPHETAMINE-DEXTROAMPHET ER 30 MG PO CP24: 30.0000 mg | ORAL_CAPSULE | ORAL | 0 refills | Status: DC

## 2022-12-26 ENCOUNTER — Other Ambulatory Visit: Payer: Self-pay | Admitting: Family Medicine

## 2022-12-26 DIAGNOSIS — Z87892 Personal history of anaphylaxis: Secondary | ICD-10-CM

## 2022-12-28 ENCOUNTER — Other Ambulatory Visit: Payer: Self-pay | Admitting: Family Medicine

## 2022-12-28 DIAGNOSIS — Z87892 Personal history of anaphylaxis: Secondary | ICD-10-CM

## 2022-12-28 DIAGNOSIS — F900 Attention-deficit hyperactivity disorder, predominantly inattentive type: Secondary | ICD-10-CM

## 2022-12-28 MED ORDER — AMPHETAMINE-DEXTROAMPHETAMINE 20 MG PO TABS: 10.0000 mg | ORAL_TABLET | Freq: Every day | ORAL | 0 refills | Status: DC

## 2022-12-28 NOTE — Telephone Encounter (Signed)
Requesting: Adderall 20 mg Contract: N/A UDS: N/A Last Visit: 07/15/2022 VV Next Visit: N/A Last Refill: 11/23/2022

## 2023-01-12 ENCOUNTER — Other Ambulatory Visit: Payer: Self-pay | Admitting: Family Medicine

## 2023-01-12 DIAGNOSIS — F900 Attention-deficit hyperactivity disorder, predominantly inattentive type: Secondary | ICD-10-CM

## 2023-01-12 MED ORDER — AMPHETAMINE-DEXTROAMPHET ER 30 MG PO CP24: 30.0000 mg | ORAL_CAPSULE | ORAL | 0 refills | Status: DC

## 2023-01-16 ENCOUNTER — Other Ambulatory Visit: Payer: Self-pay | Admitting: Family Medicine

## 2023-01-16 DIAGNOSIS — R000 Tachycardia, unspecified: Secondary | ICD-10-CM

## 2023-02-01 ENCOUNTER — Other Ambulatory Visit: Payer: Self-pay | Admitting: Family Medicine

## 2023-02-01 ENCOUNTER — Encounter: Payer: Self-pay | Admitting: Family Medicine

## 2023-02-01 DIAGNOSIS — F411 Generalized anxiety disorder: Secondary | ICD-10-CM

## 2023-02-01 DIAGNOSIS — F900 Attention-deficit hyperactivity disorder, predominantly inattentive type: Secondary | ICD-10-CM

## 2023-02-01 MED ORDER — AMPHETAMINE-DEXTROAMPHETAMINE 20 MG PO TABS: 10.0000 mg | ORAL_TABLET | Freq: Every day | ORAL | 0 refills | Status: DC

## 2023-02-01 NOTE — Telephone Encounter (Signed)
Requesting: Xanax and Adderall 20 mg  Contract: N/A UDS: N/A Last Visit: 07/15/2022 VV Next Visit: N/A Last Refill: Xanax 11/19/2022, Adderall 12/28/2022  Please Advise

## 2023-02-12 ENCOUNTER — Other Ambulatory Visit: Payer: Self-pay | Admitting: Family Medicine

## 2023-02-12 DIAGNOSIS — F900 Attention-deficit hyperactivity disorder, predominantly inattentive type: Secondary | ICD-10-CM

## 2023-02-12 MED ORDER — AMPHETAMINE-DEXTROAMPHET ER 30 MG PO CP24: 30.0000 mg | ORAL_CAPSULE | ORAL | 0 refills | Status: DC

## 2023-02-21 ENCOUNTER — Other Ambulatory Visit: Payer: Self-pay | Admitting: Family Medicine

## 2023-02-21 DIAGNOSIS — F411 Generalized anxiety disorder: Secondary | ICD-10-CM

## 2023-02-21 DIAGNOSIS — F900 Attention-deficit hyperactivity disorder, predominantly inattentive type: Secondary | ICD-10-CM

## 2023-02-22 MED ORDER — ALPRAZOLAM 0.5 MG PO TABS: ORAL_TABLET | ORAL | 0 refills | Status: DC

## 2023-02-22 MED ORDER — AMPHETAMINE-DEXTROAMPHETAMINE 20 MG PO TABS: 10.0000 mg | ORAL_TABLET | Freq: Every day | ORAL | 0 refills | Status: DC

## 2023-02-22 NOTE — Telephone Encounter (Signed)
 Requesting: Adderall 20 mg  Contract: N/A UDS: N/A Last Visit: 07/15/2022 VV Next Visit: 03/08/2023 Last Refill: 02/01/2023  Please Advise

## 2023-02-22 NOTE — Telephone Encounter (Signed)
 Requesting: Xanax  0.5 mg  Contract: N/A UDS: N/A Last Visit: 07/15/2022 VV Next Visit: 03/08/2023 Last Refill: 02/01/2023  Please Advise

## 2023-03-06 NOTE — Progress Notes (Deleted)
 [facility] at [facility] [address], [address] [place], [place] [address] [phone]  Date:  03/08/2023   Name:  Danielle [name]   DOB:  10/05/1980   MRN:  [id number]  PCP:  Pearline Cables, MD    Chief Complaint: No chief complaint on file.   History of Present Illness:  Danielle [name] is a 43 y.o. very pleasant female patient who presents with the following:  Patient seen today for routine follow-up and medication review Most recent visit with myself was a virtual visit in July  History of anxiety/panic disorder, ADD, tachycardia, significant spine disease  Danielle [name] takes Adderall 30 XR in the morning, 10 mg immediate release later in the day as needed   Flu vaccine COVID booster May be due for tetanus booster Pap smear Mammogram completed in October  Most recent visit with her spine surgeon, with [facility] was in November At that time they noted some discomfort with her spinal cord stimulator   Patient Active Problem List   Diagnosis Date Noted   Sinus tachycardia 07/14/2012   History of panic disorder 07/14/2012   History of kidney stones 07/14/2012   DDD (degenerative disc disease), lumbar 07/14/2012   History of ovarian cyst 07/14/2012   ADHD (attention deficit hyperactivity disorder) 07/12/2012    Past Medical History:  Diagnosis Date   Allergy    Anxiety    Gall stones    Kidney stones     Past Surgical History:  Procedure Laterality Date   WISDOM TOOTH EXTRACTION      Social History   Tobacco Use   Smoking status: Former    Current packs/day: 0.50    Types: Cigarettes   Smokeless tobacco: Never  Substance Use Topics   Alcohol use: Yes   Drug use: No    Family History  Problem Relation Age of Onset   Hypertension Mother    Hyperlipidemia Mother    Heart disease Mother    Hyperlipidemia Father    Hyperlipidemia Brother    Hyperlipidemia Brother     Allergies  Allergen Reactions    Beef (Bovine) Protein Anaphylaxis   Pork Allergy Anaphylaxis   Amoxicillin Rash   Penicillins Rash    Medication list has been reviewed and updated.  Current Outpatient Medications on File Prior to Visit  Medication Sig Dispense Refill   ALPRAZolam (XANAX) 0.5 MG tablet TAKE 1 TABLET BY MOUTH DAILY AT NOON AND AT BEDTIME. MAY TAKE 1 EXTRA DURING THE DAY AS NEEDED 80 tablet 0   amphetamine-dextroamphetamine (ADDERALL XR) 30 MG 24 hr capsule Take 1 capsule (30 mg total) by mouth every morning. 30 capsule 0   amphetamine-dextroamphetamine (ADDERALL XR) 30 MG 24 hr capsule Take 1 capsule (30 mg total) by mouth every morning. 30 capsule 0   amphetamine-dextroamphetamine (ADDERALL) 10 MG tablet Take one in the early afternoon as needed 30 tablet 0   amphetamine-dextroamphetamine (ADDERALL) 10 MG tablet Take one in the early afternoon as needed. 30 tablet 0   amphetamine-dextroamphetamine (ADDERALL) 10 MG tablet Take one in the early afternoon as needed. 30 tablet 0   amphetamine-dextroamphetamine (ADDERALL) 20 MG tablet Take 0.5 tablets (10 mg total) by mouth daily. Use in the afternoon as needed 15 tablet 0   cetirizine (ZYRTEC) 10 MG tablet TAKE 1 TABLET(10 MG) BY MOUTH DAILY 90 tablet 0   EPINEPHrine (EPIPEN 2-PAK) 0.3 mg/0.3 mL IJ SOAJ injection Inject 0.3 mg into the muscle  as needed for anaphylaxis. 2 each PRN   HYDROcodone-acetaminophen (NORCO/VICODIN) 5-325 MG tablet Take by mouth.     meloxicam (MOBIC) 7.5 MG tablet TAKE ONE TABLET BY MOUTH DAILY. USE AS NEEDED FOR BACK PAIN 30 tablet 1   methocarbamol (ROBAXIN) 500 MG tablet Take 1 tablet (500 mg total) by mouth every 6 (six) hours as needed for muscle spasms. 120 tablet 3   metoprolol succinate (TOPROL-XL) 100 MG 24 hr tablet Take 1 tablet (100 mg total) by mouth daily. Take with or immediately following a meal 90 tablet 0   OVER THE COUNTER MEDICATION OTC Mammalian Protein Allergy taking     PROAIR RESPICLICK 108 (90 Base) MCG/ACT AEPB  INHALE 2 PUFFS INTO THE LUNGS THREE TIMES DAILY AS NEEDED 1 each 3   No current facility-administered medications on file prior to visit.    Review of Systems:  As per HPI- otherwise negative.   Physical Examination: There were no vitals filed for this visit. There were no vitals filed for this visit. There is no height or weight on file to calculate BMI. Ideal Body Weight:    GEN: no acute distress. HEENT: Atraumatic, Normocephalic.  Ears and Nose: No external deformity. CV: RRR, No M/G/R. No JVD. No thrill. No extra heart sounds. PULM: CTA B, no wheezes, crackles, rhonchi. No retractions. No resp. distress. No accessory muscle use. ABD: S, NT, ND, +BS. No rebound. No HSM. EXTR: No c/c/e PSYCH: Normally interactive. Conversant.    Assessment and Plan: ***  Signed Abbe Amsterdam, MD

## 2023-03-08 ENCOUNTER — Encounter: Payer: Self-pay | Admitting: Family Medicine

## 2023-03-08 ENCOUNTER — Ambulatory Visit: Payer: BLUE CROSS/BLUE SHIELD | Admitting: Family Medicine

## 2023-03-15 NOTE — Progress Notes (Deleted)
 [facility] at [facility] [address] [place], [place] [address] [phone]  Date:  03/18/2023   Name:  EARNSTINE MEINDERS   DOB:  August 12, 1980   MRN:  [id number]  PCP:  Pearline Cables, MD    Chief Complaint: No chief complaint on file.   History of Present Illness:  DAMARI HILTZ is a 43 y.o. very pleasant female patient who presents with the following:  Patient is seen today for follow-up Most recent visit with myself was a virtual visit in July-I have not seen her in person in some time  History of anxiety/panic disorder, ADD, tachycardia, significant spine disease    [name] takes Adderall 30 XR in the morning, 10 mg immediate release later in the day as needed She also takes alprazolam 0.5 mg 2-3 daily Toprol-XL 100 mg daily   She is a spine patient at [facility] in [place]. Daubert.  Most recent visit in early February They gave her injection of Marcaine and Kenalog  Pap screening Mammogram Flu shot Tetanus Need to update blood work  Patient Active Problem List   Diagnosis Date Noted   Sinus tachycardia 07/14/2012   History of panic disorder 07/14/2012   History of kidney stones 07/14/2012   DDD (degenerative disc disease), lumbar 07/14/2012   History of ovarian cyst 07/14/2012   ADHD (attention deficit hyperactivity disorder) 07/12/2012    Past Medical History:  Diagnosis Date   Allergy    Anxiety    Gall stones    Kidney stones     Past Surgical History:  Procedure Laterality Date   WISDOM TOOTH EXTRACTION      Social History   Tobacco Use   Smoking status: Former    Current packs/day: 0.50    Types: Cigarettes   Smokeless tobacco: Never  Substance Use Topics   Alcohol use: Yes   Drug use: No    Family History  Problem Relation Age of Onset   Hypertension Mother    Hyperlipidemia Mother    Heart disease Mother    Hyperlipidemia Father    Hyperlipidemia Brother     Hyperlipidemia Brother     Allergies  Allergen Reactions   Beef (Bovine) Protein Anaphylaxis   Pork Allergy Anaphylaxis   Amoxicillin Rash   Penicillins Rash    Medication list has been reviewed and updated.  Current Outpatient Medications on File Prior to Visit  Medication Sig Dispense Refill   ALPRAZolam (XANAX) 0.5 MG tablet TAKE 1 TABLET BY MOUTH DAILY AT NOON AND AT BEDTIME. MAY TAKE 1 EXTRA DURING THE DAY AS NEEDED 80 tablet 0   amphetamine-dextroamphetamine (ADDERALL XR) 30 MG 24 hr capsule Take 1 capsule (30 mg total) by mouth every morning. 30 capsule 0   amphetamine-dextroamphetamine (ADDERALL XR) 30 MG 24 hr capsule Take 1 capsule (30 mg total) by mouth every morning. 30 capsule 0   amphetamine-dextroamphetamine (ADDERALL) 10 MG tablet Take one in the early afternoon as needed 30 tablet 0   amphetamine-dextroamphetamine (ADDERALL) 10 MG tablet Take one in the early afternoon as needed. 30 tablet 0   amphetamine-dextroamphetamine (ADDERALL) 10 MG tablet Take one in the early afternoon as needed. 30 tablet 0   amphetamine-dextroamphetamine (ADDERALL) 20 MG tablet Take 0.5 tablets (10 mg total) by mouth daily. Use in the afternoon as needed 15 tablet 0   cetirizine (ZYRTEC) 10 MG tablet TAKE 1 TABLET(10 MG) BY MOUTH DAILY 90 tablet 0  EPINEPHrine (EPIPEN 2-PAK) 0.3 mg/0.3 mL IJ SOAJ injection Inject 0.3 mg into the muscle as needed for anaphylaxis. 2 each PRN   HYDROcodone-acetaminophen (NORCO/VICODIN) 5-325 MG tablet Take by mouth.     metoprolol succinate (TOPROL-XL) 100 MG 24 hr tablet Take 1 tablet (100 mg total) by mouth daily. Take with or immediately following a meal 90 tablet 0   OVER THE COUNTER MEDICATION OTC Mammalian Protein Allergy taking     PROAIR RESPICLICK 108 (90 Base) MCG/ACT AEPB INHALE 2 PUFFS INTO THE LUNGS THREE TIMES DAILY AS NEEDED 1 each 3   No current facility-administered medications on file prior to visit.    Review of Systems:  As per HPI-  otherwise negative.   Physical Examination: There were no vitals filed for this visit. There were no vitals filed for this visit. There is no height or weight on file to calculate BMI. Ideal Body Weight:    GEN: no acute distress. HEENT: Atraumatic, Normocephalic.  Ears and Nose: No external deformity. CV: RRR, No M/G/R. No JVD. No thrill. No extra heart sounds. PULM: CTA B, no wheezes, crackles, rhonchi. No retractions. No resp. distress. No accessory muscle use. ABD: S, NT, ND, +BS. No rebound. No HSM. EXTR: No c/c/e PSYCH: Normally interactive. Conversant.    Assessment and Plan: ***  Signed Abbe Amsterdam, MD

## 2023-03-18 ENCOUNTER — Ambulatory Visit: Admitting: Family Medicine

## 2023-03-18 ENCOUNTER — Encounter: Payer: Self-pay | Admitting: Family Medicine

## 2023-03-18 ENCOUNTER — Ambulatory Visit: Payer: BLUE CROSS/BLUE SHIELD | Admitting: Family Medicine

## 2023-03-18 VITALS — BP 134/80 | HR 86 | Temp 97.9°F | Resp 18 | Ht 64.0 in | Wt 116.7 lb

## 2023-03-18 DIAGNOSIS — Z131 Encounter for screening for diabetes mellitus: Secondary | ICD-10-CM

## 2023-03-18 DIAGNOSIS — F411 Generalized anxiety disorder: Secondary | ICD-10-CM

## 2023-03-18 DIAGNOSIS — F4323 Adjustment disorder with mixed anxiety and depressed mood: Secondary | ICD-10-CM | POA: Insufficient documentation

## 2023-03-18 DIAGNOSIS — Z13220 Encounter for screening for lipoid disorders: Secondary | ICD-10-CM

## 2023-03-18 DIAGNOSIS — Z130 Encounter for screening for diseases of the blood and blood-forming organs and certain disorders involving the immune mechanism: Secondary | ICD-10-CM

## 2023-03-18 DIAGNOSIS — Z1329 Encounter for screening for other suspected endocrine disorder: Secondary | ICD-10-CM | POA: Diagnosis not present

## 2023-03-18 MED ORDER — FLUOXETINE HCL 20 MG PO CAPS: 20.0000 mg | ORAL_CAPSULE | Freq: Every day | ORAL | 3 refills | Status: DC

## 2023-03-18 NOTE — Patient Instructions (Addendum)
 It was good to see you today- best of luck with everything!  Let's try adding fluoxetine / prozac 20 mg daily for anxiety and depression.  You can go up to 40 mg after 2 weeks if desired- let me know how this works for you   I will get your paperwork filled out and back to you by Monday.    Please do contact your GYN office and see if they can help you out with your mammogram cost; we would like to get this done for you to make sure all is well!

## 2023-03-18 NOTE — Progress Notes (Signed)
 [facility] at [facility] [address], [address] [place], [place] [address] [phone]  Date:  03/18/2023   Name:  Danielle [name]   DOB:  July 09, 1980   MRN:  [id number]  PCP:  Pearline Cables, MD    Chief Complaint: No chief complaint on file.   History of Present Illness:  Danielle [name] is a 43 y.o. very pleasant female patient who presents with the following:  Patient is seen today for follow-up Most recent visit with myself was a virtual visit in July-I have not seen her in person in some time  History of anxiety/panic disorder, ADD, tachycardia, significant spine disease    [name] takes Adderall 30 XR in the morning, 10 mg immediate release later in the day as needed She also takes alprazolam 0.5 mg 2-3 daily Toprol-XL 100 mg daily   She is a spine patient at [facility] in [place]. Daubert.  Most recent visit in early February They gave her injection of Marcaine and Kenalog She had a spinal cord stimulator placed in August- unfortunaltey she does not feel like this is really helping her   She tripped over her puppy this am and fell- in her bedroom- hardwood floors She fell onto her left arm and left lower back-  She bumped her head a bit but not hard  No LOC   She is on long term disability and is applying for SSD- she needs some paperwork filled out   We discussed starting her on an SSRI today-  She would like to continue her xanax She does have some depression- no SI pro  Pap screening- per GYN  Mammogram- completed  Flu shot Tetanus Need to update blood work- will do today   She is [facility] Patient Active Problem List   Diagnosis Date Noted   Sinus tachycardia 07/14/2012   History of panic disorder 07/14/2012   History of kidney stones 07/14/2012   DDD (degenerative disc disease), lumbar 07/14/2012   History of ovarian cyst 07/14/2012   ADHD (attention deficit hyperactivity disorder) 07/12/2012    Past  Medical History:  Diagnosis Date   Allergy    Anxiety    Gall stones    Kidney stones     Past Surgical History:  Procedure Laterality Date   WISDOM TOOTH EXTRACTION      Social History   Tobacco Use   Smoking status: Former    Current packs/day: 0.50    Types: Cigarettes   Smokeless tobacco: Never  Substance Use Topics   Alcohol use: Yes   Drug use: No    Family History  Problem Relation Age of Onset   Hypertension Mother    Hyperlipidemia Mother    Heart disease Mother    Hyperlipidemia Father    Hyperlipidemia Brother    Hyperlipidemia Brother     Allergies  Allergen Reactions   Beef (Bovine) Protein Anaphylaxis   Pork Allergy Anaphylaxis   Amoxicillin Rash   Penicillins Rash    Medication list has been reviewed and updated.  Current Outpatient Medications on File Prior to Visit  Medication Sig Dispense Refill   ALPRAZolam (XANAX) 0.5 MG tablet TAKE 1 TABLET BY MOUTH DAILY AT NOON AND AT BEDTIME. MAY TAKE 1 EXTRA DURING THE DAY AS NEEDED 80 tablet 0   amphetamine-dextroamphetamine (ADDERALL XR) 30 MG 24 hr capsule Take 1 capsule (30 mg total) by mouth every morning. 30 capsule 0   amphetamine-dextroamphetamine (ADDERALL XR)  30 MG 24 hr capsule Take 1 capsule (30 mg total) by mouth every morning. 30 capsule 0   amphetamine-dextroamphetamine (ADDERALL) 10 MG tablet Take one in the early afternoon as needed 30 tablet 0   amphetamine-dextroamphetamine (ADDERALL) 10 MG tablet Take one in the early afternoon as needed. 30 tablet 0   amphetamine-dextroamphetamine (ADDERALL) 10 MG tablet Take one in the early afternoon as needed. 30 tablet 0   amphetamine-dextroamphetamine (ADDERALL) 20 MG tablet Take 0.5 tablets (10 mg total) by mouth daily. Use in the afternoon as needed 15 tablet 0   cetirizine (ZYRTEC) 10 MG tablet TAKE 1 TABLET(10 MG) BY MOUTH DAILY 90 tablet 0   EPINEPHrine (EPIPEN 2-PAK) 0.3 mg/0.3 mL IJ SOAJ injection Inject 0.3 mg into the muscle as needed  for anaphylaxis. 2 each PRN   HYDROcodone-acetaminophen (NORCO/VICODIN) 5-325 MG tablet Take by mouth.     metoprolol succinate (TOPROL-XL) 100 MG 24 hr tablet Take 1 tablet (100 mg total) by mouth daily. Take with or immediately following a meal 90 tablet 0   OVER THE COUNTER MEDICATION OTC Mammalian Protein Allergy taking     PROAIR RESPICLICK 108 (90 Base) MCG/ACT AEPB INHALE 2 PUFFS INTO THE LUNGS THREE TIMES DAILY AS NEEDED 1 each 3   No current facility-administered medications on file prior to visit.    Review of Systems:  As per HPI- otherwise negative.   Physical Examination: There were no vitals filed for this visit. There were no vitals filed for this visit. There is no height or weight on file to calculate BMI. Ideal Body Weight:   Blood pressure 134/80, pulse 86, temperature 97.6, O2 sats 99%  GEN: no acute distress.  Slender build HEENT: Atraumatic, Normocephalic.  Ears and Nose: No external deformity. CV: RRR, No M/G/R. No JVD. No thrill. No extra heart sounds. PULM: CTA B, no wheezes, crackles, rhonchi. No retractions. No resp. distress. No accessory muscle use. ABD: S, NT, ND, +BS. No rebound. No HSM. EXTR: No c/c/e PSYCH: Normally interactive. Conversant.  Spinal cord stimulator in place left flank  Assessment and Plan: GAD (generalized anxiety disorder) - Plan: FLUoxetine (PROZAC) 20 MG capsule  Screening for diabetes mellitus - Plan: Comprehensive metabolic panel, Hemoglobin A1c  Screening, lipid - Plan: Lipid panel  Thyroid disorder screening - Plan: TSH  Screening for deficiency anemia - Plan: CBC  Adjustment reaction with anxiety and depression  Patient seen today for physical exam.  Routine blood work is ordered, she plans to have this done at a [facility] draw station for cost savings.  She has some paperwork that needs to be completed for her Social Security disability case  Signed Abbe Amsterdam, MD

## 2023-03-22 ENCOUNTER — Encounter: Payer: Self-pay | Admitting: Family Medicine

## 2023-03-22 DIAGNOSIS — Z130 Encounter for screening for diseases of the blood and blood-forming organs and certain disorders involving the immune mechanism: Secondary | ICD-10-CM

## 2023-03-22 DIAGNOSIS — Z1329 Encounter for screening for other suspected endocrine disorder: Secondary | ICD-10-CM

## 2023-03-22 DIAGNOSIS — Z131 Encounter for screening for diabetes mellitus: Secondary | ICD-10-CM

## 2023-03-22 DIAGNOSIS — Z13220 Encounter for screening for lipoid disorders: Secondary | ICD-10-CM

## 2023-03-24 NOTE — Telephone Encounter (Signed)
 Forms have been emailed to the pts attorney.

## 2023-03-25 ENCOUNTER — Other Ambulatory Visit: Payer: Self-pay | Admitting: Family Medicine

## 2023-03-25 DIAGNOSIS — Z87892 Personal history of anaphylaxis: Secondary | ICD-10-CM

## 2023-03-28 ENCOUNTER — Encounter: Payer: Self-pay | Admitting: Family Medicine

## 2023-03-28 DIAGNOSIS — F4323 Adjustment disorder with mixed anxiety and depressed mood: Secondary | ICD-10-CM

## 2023-03-28 DIAGNOSIS — F900 Attention-deficit hyperactivity disorder, predominantly inattentive type: Secondary | ICD-10-CM

## 2023-03-28 DIAGNOSIS — F411 Generalized anxiety disorder: Secondary | ICD-10-CM

## 2023-03-29 MED ORDER — FLUOXETINE HCL 40 MG PO CAPS
40.0000 mg | ORAL_CAPSULE | Freq: Every day | ORAL | 3 refills | Status: DC
Start: 2023-03-29 — End: 2023-07-13

## 2023-03-29 MED ORDER — AMPHETAMINE-DEXTROAMPHETAMINE 20 MG PO TABS: 10.0000 mg | ORAL_TABLET | Freq: Every day | ORAL | 0 refills | Status: DC

## 2023-03-29 NOTE — Telephone Encounter (Signed)
 Letter/ orders have been faxed.

## 2023-03-29 NOTE — Telephone Encounter (Signed)
 Pt picked up the 20 mg on 03/18/23. Okay to take 2 of those until Rx has been gone?   Also pt would like refill on Adderall 20 mg

## 2023-04-02 ENCOUNTER — Other Ambulatory Visit: Payer: Self-pay | Admitting: Family Medicine

## 2023-04-03 LAB — CBC WITH DIFFERENTIAL/PLATELET
Basophils Absolute: 0 10*3/uL (ref 0.0–0.2)
Basos: 1 %
EOS (ABSOLUTE): 0.1 10*3/uL (ref 0.0–0.4)
Eos: 1 %
Hematocrit: 42 % (ref 34.0–46.6)
Hemoglobin: 13.9 g/dL (ref 11.1–15.9)
Immature Grans (Abs): 0 10*3/uL (ref 0.0–0.1)
Immature Granulocytes: 0 %
Lymphocytes Absolute: 2.6 10*3/uL (ref 0.7–3.1)
Lymphs: 35 %
MCH: 30.3 pg (ref 26.6–33.0)
MCHC: 33.1 g/dL (ref 31.5–35.7)
MCV: 92 fL (ref 79–97)
Monocytes Absolute: 0.4 10*3/uL (ref 0.1–0.9)
Monocytes: 5 %
Neutrophils Absolute: 4.4 10*3/uL (ref 1.4–7.0)
Neutrophils: 58 %
Platelets: 437 10*3/uL (ref 150–450)
RBC: 4.59 x10E6/uL (ref 3.77–5.28)
RDW: 12.5 % (ref 11.7–15.4)
WBC: 7.5 10*3/uL (ref 3.4–10.8)

## 2023-04-03 LAB — COMPREHENSIVE METABOLIC PANEL
ALT: 12 IU/L (ref 0–32)
AST: 8 IU/L (ref 0–40)
Albumin: 5 g/dL — ABNORMAL HIGH (ref 3.9–4.9)
Alkaline Phosphatase: 54 IU/L (ref 44–121)
BUN/Creatinine Ratio: 15 (ref 9–23)
BUN: 14 mg/dL (ref 6–24)
Bilirubin Total: <0.2 mg/dL (ref 0.0–1.2)
CO2: 23 mmol/L (ref 20–29)
Calcium: 9.9 mg/dL (ref 8.7–10.2)
Chloride: 105 mmol/L (ref 96–106)
Creatinine, Ser: 0.91 mg/dL (ref 0.57–1.00)
Globulin, Total: 2.2 g/dL (ref 1.5–4.5)
Glucose: 96 mg/dL (ref 70–99)
Potassium: 4.5 mmol/L (ref 3.5–5.2)
Sodium: 143 mmol/L (ref 134–144)
Total Protein: 7.2 g/dL (ref 6.0–8.5)
eGFR: 82 mL/min/{1.73_m2} (ref >59)

## 2023-04-03 LAB — HGB A1C W/O EAG: Hgb A1c MFr Bld: 5.5 % (ref 4.8–5.6)

## 2023-04-03 LAB — TSH: TSH: 0.38 u[IU]/mL — ABNORMAL LOW (ref 0.450–4.500)

## 2023-04-03 LAB — LIPID PANEL W/O CHOL/HDL RATIO
Cholesterol, Total: 152 mg/dL (ref 100–199)
HDL: 44 mg/dL (ref >39)
LDL Chol Calc (NIH): 80 mg/dL (ref 0–99)
Triglycerides: 160 mg/dL — ABNORMAL HIGH (ref 0–149)
VLDL Cholesterol Cal: 28 mg/dL (ref 5–40)

## 2023-04-05 ENCOUNTER — Encounter: Payer: Self-pay | Admitting: Family Medicine

## 2023-04-05 DIAGNOSIS — Z1329 Encounter for screening for other suspected endocrine disorder: Secondary | ICD-10-CM

## 2023-04-05 NOTE — Progress Notes (Signed)
 Received her labs- message to pt  Results for orders placed or performed in visit on 04/02/23  CBC with Differential/Platelet   Collection Time: 04/02/23  2:57 PM  Result Value Ref Range   WBC 7.5 3.4 - 10.8 x10E3/uL   RBC 4.59 3.77 - 5.28 x10E6/uL   Hemoglobin 13.9 11.1 - 15.9 g/dL   Hematocrit 16.1 09.6 - 46.6 %   MCV 92 79 - 97 fL   MCH 30.3 26.6 - 33.0 pg   MCHC 33.1 31.5 - 35.7 g/dL   RDW 04.5 40.9 - 81.1 %   Platelets 437 150 - 450 x10E3/uL   Neutrophils 58 Not Estab. %   Lymphs 35 Not Estab. %   Monocytes 5 Not Estab. %   Eos 1 Not Estab. %   Basos 1 Not Estab. %   Neutrophils Absolute 4.4 1.4 - 7.0 x10E3/uL   Lymphocytes Absolute 2.6 0.7 - 3.1 x10E3/uL   Monocytes Absolute 0.4 0.1 - 0.9 x10E3/uL   EOS (ABSOLUTE) 0.1 0.0 - 0.4 x10E3/uL   Basophils Absolute 0.0 0.0 - 0.2 x10E3/uL   Immature Granulocytes 0 Not Estab. %   Immature Grans (Abs) 0.0 0.0 - 0.1 x10E3/uL  Comprehensive metabolic panel   Collection Time: 04/02/23  2:57 PM  Result Value Ref Range   Glucose 96 70 - 99 mg/dL   BUN 14 6 - 24 mg/dL   Creatinine, Ser 9.14 0.57 - 1.00 mg/dL   eGFR 82 >78 GN/FAO/1.30   BUN/Creatinine Ratio 15 9 - 23   Sodium 143 134 - 144 mmol/L   Potassium 4.5 3.5 - 5.2 mmol/L   Chloride 105 96 - 106 mmol/L   CO2 23 20 - 29 mmol/L   Calcium 9.9 8.7 - 10.2 mg/dL   Total Protein 7.2 6.0 - 8.5 g/dL   Albumin 5.0 (H) 3.9 - 4.9 g/dL   Globulin, Total 2.2 1.5 - 4.5 g/dL   Bilirubin Total <8.6 0.0 - 1.2 mg/dL   Alkaline Phosphatase 54 44 - 121 IU/L   AST 8 0 - 40 IU/L   ALT 12 0 - 32 IU/L  Lipid Panel w/o Chol/HDL Ratio   Collection Time: 04/02/23  2:57 PM  Result Value Ref Range   Cholesterol, Total 152 100 - 199 mg/dL   Triglycerides 578 (H) 0 - 149 mg/dL   HDL 44 >46 mg/dL   VLDL Cholesterol Cal 28 5 - 40 mg/dL   LDL Chol Calc (NIH) 80 0 - 99 mg/dL  Hgb N6E w/o eAG   Collection Time: 04/02/23  2:57 PM  Result Value Ref Range   Hgb A1c MFr Bld 5.5 4.8 - 5.6 %  TSH    Collection Time: 04/02/23  2:57 PM  Result Value Ref Range   TSH 0.380 (L) 0.450 - 4.500 uIU/mL

## 2023-04-07 ENCOUNTER — Other Ambulatory Visit: Payer: Self-pay | Admitting: Family Medicine

## 2023-04-07 DIAGNOSIS — F900 Attention-deficit hyperactivity disorder, predominantly inattentive type: Secondary | ICD-10-CM

## 2023-04-07 DIAGNOSIS — F411 Generalized anxiety disorder: Secondary | ICD-10-CM

## 2023-04-07 MED ORDER — AMPHETAMINE-DEXTROAMPHET ER 30 MG PO CP24: 30.0000 mg | ORAL_CAPSULE | ORAL | 0 refills | Status: DC

## 2023-04-07 MED ORDER — ALPRAZOLAM 0.5 MG PO TABS: ORAL_TABLET | ORAL | 0 refills | Status: DC

## 2023-05-10 ENCOUNTER — Other Ambulatory Visit: Payer: Self-pay | Admitting: Family Medicine

## 2023-05-10 DIAGNOSIS — F411 Generalized anxiety disorder: Secondary | ICD-10-CM

## 2023-05-10 DIAGNOSIS — R000 Tachycardia, unspecified: Secondary | ICD-10-CM

## 2023-05-10 DIAGNOSIS — F900 Attention-deficit hyperactivity disorder, predominantly inattentive type: Secondary | ICD-10-CM

## 2023-05-10 MED ORDER — AMPHETAMINE-DEXTROAMPHET ER 30 MG PO CP24: 30.0000 mg | ORAL_CAPSULE | ORAL | 0 refills | Status: DC

## 2023-05-10 MED ORDER — METOPROLOL SUCCINATE ER 100 MG PO TB24: 100.0000 mg | ORAL_TABLET | Freq: Every day | ORAL | 1 refills | Status: DC

## 2023-05-10 MED ORDER — AMPHETAMINE-DEXTROAMPHETAMINE 20 MG PO TABS: 10.0000 mg | ORAL_TABLET | Freq: Every day | ORAL | 0 refills | Status: DC

## 2023-05-22 ENCOUNTER — Other Ambulatory Visit: Payer: Self-pay | Admitting: Family Medicine

## 2023-05-22 DIAGNOSIS — F900 Attention-deficit hyperactivity disorder, predominantly inattentive type: Secondary | ICD-10-CM

## 2023-05-24 MED ORDER — AMPHETAMINE-DEXTROAMPHETAMINE 20 MG PO TABS: 10.0000 mg | ORAL_TABLET | Freq: Every day | ORAL | 0 refills | Status: DC

## 2023-05-24 MED ORDER — AMPHETAMINE-DEXTROAMPHET ER 30 MG PO CP24: 30.0000 mg | ORAL_CAPSULE | ORAL | 0 refills | Status: DC

## 2023-06-15 ENCOUNTER — Encounter: Payer: Self-pay | Admitting: Family Medicine

## 2023-06-15 ENCOUNTER — Other Ambulatory Visit: Payer: Self-pay | Admitting: Family Medicine

## 2023-06-17 ENCOUNTER — Encounter: Payer: Self-pay | Admitting: Family Medicine

## 2023-06-21 ENCOUNTER — Encounter: Payer: Self-pay | Admitting: *Deleted

## 2023-07-10 ENCOUNTER — Other Ambulatory Visit: Payer: Self-pay | Admitting: Family Medicine

## 2023-07-10 DIAGNOSIS — R000 Tachycardia, unspecified: Secondary | ICD-10-CM

## 2023-07-13 ENCOUNTER — Encounter: Payer: Self-pay | Admitting: Family Medicine

## 2023-07-13 DIAGNOSIS — F411 Generalized anxiety disorder: Secondary | ICD-10-CM

## 2023-07-13 DIAGNOSIS — F4323 Adjustment disorder with mixed anxiety and depressed mood: Secondary | ICD-10-CM

## 2023-07-13 DIAGNOSIS — R000 Tachycardia, unspecified: Secondary | ICD-10-CM

## 2023-07-13 DIAGNOSIS — F900 Attention-deficit hyperactivity disorder, predominantly inattentive type: Secondary | ICD-10-CM

## 2023-07-13 MED ORDER — ALPRAZOLAM 0.5 MG PO TABS: ORAL_TABLET | ORAL | 2 refills | Status: DC

## 2023-07-13 MED ORDER — AMPHETAMINE-DEXTROAMPHET ER 30 MG PO CP24: 30.0000 mg | ORAL_CAPSULE | ORAL | 0 refills | Status: DC

## 2023-07-13 MED ORDER — FLUOXETINE HCL 40 MG PO CAPS: 40.0000 mg | ORAL_CAPSULE | Freq: Every day | ORAL | 3 refills | Status: DC

## 2023-07-13 MED ORDER — METOPROLOL SUCCINATE ER 100 MG PO TB24: 100.0000 mg | ORAL_TABLET | Freq: Every day | ORAL | 3 refills | Status: DC

## 2023-07-13 MED ORDER — AMPHETAMINE-DEXTROAMPHETAMINE 20 MG PO TABS: 10.0000 mg | ORAL_TABLET | Freq: Every day | ORAL | 0 refills | Status: DC

## 2023-08-11 ENCOUNTER — Encounter: Payer: Self-pay | Admitting: Family Medicine

## 2023-08-11 DIAGNOSIS — F900 Attention-deficit hyperactivity disorder, predominantly inattentive type: Secondary | ICD-10-CM

## 2023-08-20 MED ORDER — AMPHETAMINE-DEXTROAMPHET ER 30 MG PO CP24: 30.0000 mg | ORAL_CAPSULE | ORAL | 0 refills | Status: DC

## 2023-08-20 MED ORDER — AMPHETAMINE-DEXTROAMPHETAMINE 10 MG PO TABS: ORAL_TABLET | ORAL | 0 refills | Status: DC

## 2023-08-20 NOTE — Addendum Note (Signed)
 Addended by: WATT RAISIN C on: 08/20/2023 09:12 PM   Modules accepted: Orders

## 2023-09-06 ENCOUNTER — Encounter: Payer: Self-pay | Admitting: Family Medicine

## 2023-09-08 ENCOUNTER — Encounter: Payer: Self-pay | Admitting: Family Medicine

## 2023-09-25 NOTE — Progress Notes (Signed)
 [facility] [address] [place], [place] [address] [phone]  Date:  09/29/2023   Name:  Danielle [name]   DOB:  02-16-1980   MRN:  [id number]  PCP:  Watt Harlene BROCKS, MD    Chief Complaint: No chief complaint on file.   History of Present Illness:  Danielle [name] is a 43 y.o. 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  Most recent visit 7/29 1. Chronic pain: Experiencing pain over the battery and dermal atrophy at the site of a previous steroid injection. Options discussed include moving the battery to a different location or removing the stimulator entirely. The possibility of removing the area of subdermal atrophy or having a plastic surgeon address it was also considered.  Medications were refilled today. Discussed the potential benefits and risks of moving the battery or removing the stimulator, including the possibility of improved pain management and the risk of surgical complications. The option of consulting a plastic surgeon for the dermal atrophy was also reviewed, highlighting the potential for improved skin appearance and the risks associated with plastic surgery. Encouraged to monitor symptoms and report any changes or worsening pain. Advised to consider lifestyle modifications to manage chronic pain, such as regular exercise, stress management techniques, and maintaining a healthy diet.   Mammogram due next month Pap is up-to-date Labs completed in March Recommend flu shot and tetanus shot   Discussed the use of AI scribe software for clinical note transcription with the patient, who gave verbal consent to  proceed.  History of Present Illness Danielle [name] is a 43 year old female with lumbar disease who presents for paperwork completion and medication management.  She experiences significant back pain related to her lumbar disease, describing it as 'extreme'. She has undergone multiple treatments, including a spinal column stimulator and four trigger shots. The stimulator has caused an indention and muscular fat atrophy, leading to discomfort from the battery. She is considering its removal. She is considering whether to have another back surgery, which may need to include plastic surgery, and is deciding between locations in [place] , [place] , or [place].  She is currently taking fluoxetine  for depression, with a regimen of 40 mg plus an additional 20 mg, totaling 60 mg daily. She has been taking one 40 mg pill and one 20 mg pill of fluoxetine  daily, totaling 60 mg. She feels a need for an increase in her depression medication due to ongoing symptoms.  She is in the process of moving closer to her parents following a divorce and has recently started receiving social security disability benefits. She has not been working since January 2021 and is unable to perform many physical activities due to her back condition. She can sit for eight hours but can only stand for one hour and walk for thirty minutes. She cannot climb, balance, stoop, kneel, or crawl due to pain and balance issues. She can reach at waist level and handle objects but has not driven in years due to her condition.  She has three cats and one dog that serve as emotional support animals, which help manage her anxiety and depression. She experiences separation anxiety from  her animals and considers them essential for her emotional well-being.  She is due for a mammogram and a tetanus shot, with her last tetanus vaccination in 2014. She is also planning to get a flu shot this fall.   Patient Active Problem List   Diagnosis Date  Noted   Adjustment reaction with anxiety and depression 03/18/2023   Sinus tachycardia 07/14/2012   History of panic disorder 07/14/2012   History of kidney stones 07/14/2012   DDD (degenerative disc disease), lumbar 07/14/2012   History of ovarian cyst 07/14/2012   ADHD (attention deficit hyperactivity disorder) 07/12/2012    Past Medical History:  Diagnosis Date   Allergy    Anxiety    Gall stones    Kidney stones     Past Surgical History:  Procedure Laterality Date   WISDOM TOOTH EXTRACTION      Social History   Tobacco Use   Smoking status: Former    Current packs/day: 0.50    Types: Cigarettes   Smokeless tobacco: Never  Substance Use Topics   Alcohol use: Yes   Drug use: No    Family History  Problem Relation Age of Onset   Hypertension Mother    Hyperlipidemia Mother    Heart disease Mother    Hyperlipidemia Father    Hyperlipidemia Brother    Hyperlipidemia Brother     Allergies  Allergen Reactions   Beef (Bovine) Protein Anaphylaxis   Pork Allergy Anaphylaxis   Amoxicillin Rash   Penicillins Rash    Medication list has been reviewed and updated.  Current Outpatient Medications on File Prior to Visit  Medication Sig Dispense Refill   ALPRAZolam  (XANAX ) 0.5 MG tablet TAKE 1 TABLET BY MOUTH DAILY AT NOON AND AT BEDTIME. MAY TAKE 1 EXTRA DURING THE DAY AS NEEDED 80 tablet 2   amphetamine -dextroamphetamine  (ADDERALL XR) 30 MG 24 hr capsule Take 1 capsule (30 mg total) by mouth every morning. 30 capsule 0   amphetamine -dextroamphetamine  (ADDERALL XR) 30 MG 24 hr capsule Take 1 capsule (30 mg total) by mouth every morning. 30 capsule 0   amphetamine -dextroamphetamine  (ADDERALL) 10 MG tablet Take one in the early afternoon as needed 30 tablet 0   amphetamine -dextroamphetamine  (ADDERALL) 10 MG tablet Take one in the early afternoon as needed. 30 tablet 0   amphetamine -dextroamphetamine  (ADDERALL) 10 MG tablet Take one in the early afternoon as needed. 30  tablet 0   amphetamine -dextroamphetamine  (ADDERALL) 20 MG tablet Take 0.5 tablets (10 mg total) by mouth daily. Use in the afternoon as needed 15 tablet 0   cetirizine  (ZYRTEC ) 10 MG tablet TAKE 1 TABLET(10 MG) BY MOUTH DAILY 90 tablet 1   EPINEPHrine  (EPIPEN  2-PAK) 0.3 mg/0.3 mL IJ SOAJ injection Inject 0.3 mg into the muscle as needed for anaphylaxis. 2 each PRN   FLUoxetine  (PROZAC ) 40 MG capsule Take 1 capsule (40 mg total) by mouth daily. 90 capsule 3   HYDROcodone-acetaminophen (NORCO/VICODIN) 5-325 MG tablet Take by mouth.     metoprolol  succinate (TOPROL -XL) 100 MG 24 hr tablet Take 1 tablet (100 mg total) by mouth daily. Take with or immediately following a meal 90 tablet 3   OVER THE COUNTER MEDICATION OTC Mammalian Protein Allergy taking     PROAIR  RESPICLICK 108 (90 Base) MCG/ACT AEPB INHALE 2 PUFFS INTO THE LUNGS THREE TIMES DAILY AS NEEDED 1 each 3   No current facility-administered medications on file prior to visit.    Review of Systems:  As per HPI- otherwise negative.  Physical Examination: There were no vitals filed for this visit. There were no vitals filed for this visit. There is no height or weight on file to calculate BMI. Ideal Body Weight:     Assessment and Plan: No diagnosis found.  Assessment & Plan Lumbar spine disease with chronic pain and spinal cord stimulator complication Chronic lumbar spine disease with significant pain and complications from spinal cord stimulator, including muscular fat atrophy and discomfort from the stimulator battery. Considering removal of the stimulator due to these issues. Extreme pain persists despite four trigger point injections. Indention at stimulator site noted, contemplating further surgery, possibly involving plastic surgery. - Complete [employer] paperwork indicating lumbar disease as primary diagnosis. - Discuss potential removal of spinal cord stimulator with neurosurgeon. - Consider location for future surgery  ([place] or [place]).  Adjustment disorder with mixed anxiety and depressed mood Adjustment disorder exacerbated by life stressors including divorce and housing transition. Currently on fluoxetine  60 mg daily, which is helping manage symptoms. Increased medication to reach this dose. - Prescribe fluoxetine  20 mg to supplement current 40 mg dose to maintain 60 mg daily. - Provide letter for emotional support animals to assist with anxiety and depression management.  Separation and housing transition related to divorce Undergoing divorce and housing transition, contributing to stress and adjustment disorder. Managing logistics of moving and accommodating emotional support animals. - Provide emotional support animal letter for housing accommodations.  General Health Maintenance Routine health maintenance due, including mammogram and tetanus vaccination. Advised to receive flu shot this fall. - Order mammogram at [facility]. - recommend tetanus vaccination. - Recommend flu shot this fall.  Signed Harlene Schroeder, MD

## 2023-09-29 ENCOUNTER — Telehealth (INDEPENDENT_AMBULATORY_CARE_PROVIDER_SITE_OTHER): Admitting: Family Medicine

## 2023-09-29 VITALS — Ht 64.0 in

## 2023-09-29 DIAGNOSIS — F4323 Adjustment disorder with mixed anxiety and depressed mood: Secondary | ICD-10-CM | POA: Diagnosis not present

## 2023-09-29 DIAGNOSIS — M51362 Other intervertebral disc degeneration, lumbar region with discogenic back pain and lower extremity pain: Secondary | ICD-10-CM

## 2023-09-29 DIAGNOSIS — Z1231 Encounter for screening mammogram for malignant neoplasm of breast: Secondary | ICD-10-CM | POA: Diagnosis not present

## 2023-09-29 MED ORDER — FLUOXETINE HCL 20 MG PO CAPS: 20.0000 mg | ORAL_CAPSULE | Freq: Every day | ORAL | 3 refills | Status: DC

## 2023-10-10 ENCOUNTER — Other Ambulatory Visit: Payer: Self-pay | Admitting: Family Medicine

## 2023-10-10 DIAGNOSIS — Z87892 Personal history of anaphylaxis: Secondary | ICD-10-CM

## 2023-10-11 ENCOUNTER — Encounter: Payer: Self-pay | Admitting: *Deleted

## 2023-10-20 ENCOUNTER — Encounter: Payer: Self-pay | Admitting: Family Medicine

## 2023-10-20 DIAGNOSIS — F900 Attention-deficit hyperactivity disorder, predominantly inattentive type: Secondary | ICD-10-CM

## 2023-10-20 MED ORDER — AMPHETAMINE-DEXTROAMPHETAMINE 10 MG PO TABS: ORAL_TABLET | ORAL | 0 refills | Status: DC

## 2023-10-20 MED ORDER — AMPHETAMINE-DEXTROAMPHET ER 30 MG PO CP24: 30.0000 mg | ORAL_CAPSULE | ORAL | 0 refills | Status: DC

## 2023-10-21 ENCOUNTER — Telehealth (HOSPITAL_BASED_OUTPATIENT_CLINIC_OR_DEPARTMENT_OTHER): Payer: Self-pay

## 2023-10-28 DIAGNOSIS — N6001 Solitary cyst of right breast: Secondary | ICD-10-CM | POA: Diagnosis not present

## 2023-10-28 DIAGNOSIS — R928 Other abnormal and inconclusive findings on diagnostic imaging of breast: Secondary | ICD-10-CM | POA: Diagnosis not present

## 2023-12-14 ENCOUNTER — Encounter: Payer: Self-pay | Admitting: Family Medicine

## 2023-12-14 DIAGNOSIS — F411 Generalized anxiety disorder: Secondary | ICD-10-CM

## 2023-12-14 DIAGNOSIS — Z87892 Personal history of anaphylaxis: Secondary | ICD-10-CM

## 2023-12-14 DIAGNOSIS — F900 Attention-deficit hyperactivity disorder, predominantly inattentive type: Secondary | ICD-10-CM

## 2023-12-14 DIAGNOSIS — R000 Tachycardia, unspecified: Secondary | ICD-10-CM

## 2023-12-14 DIAGNOSIS — F4323 Adjustment disorder with mixed anxiety and depressed mood: Secondary | ICD-10-CM

## 2023-12-14 MED ORDER — FLUOXETINE HCL 20 MG PO CAPS: 20.0000 mg | ORAL_CAPSULE | Freq: Every day | ORAL | 0 refills | Status: DC

## 2023-12-14 MED ORDER — FLUOXETINE HCL 40 MG PO CAPS: 40.0000 mg | ORAL_CAPSULE | Freq: Every day | ORAL | 0 refills | Status: DC

## 2023-12-14 MED ORDER — CETIRIZINE HCL 10 MG PO TABS: 10.0000 mg | ORAL_TABLET | Freq: Every day | ORAL | 0 refills | Status: DC

## 2023-12-14 MED ORDER — METOPROLOL SUCCINATE ER 100 MG PO TB24: 100.0000 mg | ORAL_TABLET | Freq: Every day | ORAL | 0 refills | Status: DC

## 2023-12-15 MED ORDER — CETIRIZINE HCL 10 MG PO TABS: 10.0000 mg | ORAL_TABLET | Freq: Every day | ORAL | 3 refills | Status: DC

## 2023-12-15 MED ORDER — AMPHETAMINE-DEXTROAMPHETAMINE 20 MG PO TABS: 10.0000 mg | ORAL_TABLET | Freq: Every day | ORAL | 0 refills | Status: DC

## 2023-12-15 MED ORDER — FLUOXETINE HCL 20 MG PO CAPS: 20.0000 mg | ORAL_CAPSULE | Freq: Every day | ORAL | 3 refills | Status: DC

## 2023-12-15 MED ORDER — ALPRAZOLAM 0.5 MG PO TABS: ORAL_TABLET | ORAL | 2 refills | Status: DC

## 2023-12-15 MED ORDER — AMPHETAMINE-DEXTROAMPHET ER 30 MG PO CP24: 30.0000 mg | ORAL_CAPSULE | ORAL | 0 refills | Status: DC

## 2023-12-15 MED ORDER — METOPROLOL SUCCINATE ER 100 MG PO TB24: 100.0000 mg | ORAL_TABLET | Freq: Every day | ORAL | 3 refills | Status: DC

## 2023-12-15 MED ORDER — FLUOXETINE HCL 40 MG PO CAPS: 40.0000 mg | ORAL_CAPSULE | Freq: Every day | ORAL | 3 refills | Status: DC

## 2024-01-12 DIAGNOSIS — Z87892 Personal history of anaphylaxis: Secondary | ICD-10-CM

## 2024-01-13 ENCOUNTER — Encounter: Payer: Self-pay | Admitting: Family Medicine

## 2024-01-13 DIAGNOSIS — Z87892 Personal history of anaphylaxis: Secondary | ICD-10-CM

## 2024-01-13 DIAGNOSIS — F900 Attention-deficit hyperactivity disorder, predominantly inattentive type: Secondary | ICD-10-CM

## 2024-01-13 DIAGNOSIS — Z87898 Personal history of other specified conditions: Secondary | ICD-10-CM

## 2024-01-13 DIAGNOSIS — F4323 Adjustment disorder with mixed anxiety and depressed mood: Secondary | ICD-10-CM

## 2024-01-13 DIAGNOSIS — F411 Generalized anxiety disorder: Secondary | ICD-10-CM

## 2024-01-14 MED ORDER — AMPHETAMINE-DEXTROAMPHETAMINE 20 MG PO TABS: 10.0000 mg | ORAL_TABLET | Freq: Every day | ORAL | 0 refills | Status: DC

## 2024-01-14 MED ORDER — ALPRAZOLAM 0.5 MG PO TABS: ORAL_TABLET | ORAL | 2 refills | Status: AC

## 2024-01-14 MED ORDER — EPINEPHRINE 0.3 MG/0.3ML IJ SOAJ: 0.3000 mg | INTRAMUSCULAR | 99 refills | Status: AC | PRN

## 2024-01-14 MED ORDER — ONDANSETRON HCL 8 MG PO TABS: 8.0000 mg | ORAL_TABLET | Freq: Three times a day (TID) | ORAL | 0 refills | Status: DC | PRN

## 2024-01-14 MED ORDER — AMPHETAMINE-DEXTROAMPHET ER 30 MG PO CP24: 30.0000 mg | ORAL_CAPSULE | ORAL | 0 refills | Status: DC

## 2024-01-14 MED ORDER — FLUOXETINE HCL 40 MG PO CAPS: 80.0000 mg | ORAL_CAPSULE | Freq: Every day | ORAL | 3 refills | Status: AC

## 2024-01-30 ENCOUNTER — Other Ambulatory Visit: Payer: Self-pay | Admitting: Family Medicine

## 2024-01-30 DIAGNOSIS — Z87898 Personal history of other specified conditions: Secondary | ICD-10-CM

## 2024-02-09 ENCOUNTER — Other Ambulatory Visit: Payer: Self-pay | Admitting: Family Medicine

## 2024-02-14 ENCOUNTER — Encounter: Payer: Self-pay | Admitting: Family Medicine

## 2024-02-14 DIAGNOSIS — F900 Attention-deficit hyperactivity disorder, predominantly inattentive type: Secondary | ICD-10-CM

## 2024-02-14 DIAGNOSIS — F411 Generalized anxiety disorder: Secondary | ICD-10-CM

## 2024-02-14 DIAGNOSIS — R000 Tachycardia, unspecified: Secondary | ICD-10-CM

## 2024-02-14 DIAGNOSIS — Z87892 Personal history of anaphylaxis: Secondary | ICD-10-CM

## 2024-02-14 MED ORDER — METOPROLOL SUCCINATE ER 100 MG PO TB24: 100.0000 mg | ORAL_TABLET | Freq: Every day | ORAL | 1 refills | Status: AC

## 2024-02-16 ENCOUNTER — Telehealth: Payer: Self-pay

## 2024-02-16 MED ORDER — AMPHETAMINE-DEXTROAMPHETAMINE 20 MG PO TABS: 10.0000 mg | ORAL_TABLET | Freq: Every day | ORAL | 0 refills | Status: AC

## 2024-02-16 MED ORDER — AMPHETAMINE-DEXTROAMPHET ER 30 MG PO CP24: 30.0000 mg | ORAL_CAPSULE | ORAL | 0 refills | Status: AC

## 2024-02-16 NOTE — Telephone Encounter (Signed)
 Copied from CRM [id number]. Topic: Clinical - Prescription Issue >> Feb 16, 2024  9:30 AM Berneda FALCON wrote: Reason for CRM: Leita from [facility] States there are concerns and red flags for the following medications called in for this patient:  -HYDROcodone-acetaminophen (NORCO/VICODIN) 5-325 MG table -Carisoprodol (which I did not see on her med list), patient was attempting to pay cash, but recently had this filled at another [facility] on 01/30/24 -Amphetamine  salts 20 MG (which I also could not find on the med list)  States the concern is having patient on all of these medications at the same time, and wanting to be sure the PCP wants this filled for the patient.  Pharmacy:  [phone] - [place], [place] - [address] [place] [address] Phone: [phone]  Fax: [phone] DEA #: [id number] DAW Reason: --

## 2024-02-16 NOTE — Addendum Note (Signed)
 Addended by: WATT RAISIN C on: 02/16/2024 08:24 AM   Modules accepted: Orders

## 2024-02-16 NOTE — Telephone Encounter (Signed)
 Pharmacist and addressed her concerns.  She will also reach out to the provider who is prescribing the S[place] and hydrocodone which are not from me
